# Patient Record
Sex: Female | Born: 1935 | Race: White | Hispanic: No | Marital: Married
Health system: Southern US, Community
[De-identification: ages and names within clinical notes are randomized; demographics above are authoritative.]

## PROBLEM LIST (undated history)

## (undated) DIAGNOSIS — I5032 Chronic diastolic (congestive) heart failure: Secondary | ICD-10-CM

## (undated) DIAGNOSIS — J9621 Acute and chronic respiratory failure with hypoxia: Secondary | ICD-10-CM

## (undated) DIAGNOSIS — Z8669 Personal history of other diseases of the nervous system and sense organs: Secondary | ICD-10-CM

## (undated) DIAGNOSIS — Z930 Tracheostomy status: Secondary | ICD-10-CM

---

## 2019-09-26 ENCOUNTER — Inpatient Hospital Stay
Admit: 2019-09-26 | Discharge: 2019-10-24 | Disposition: A | Discharge status: Skilled Nursing Facility | Payer: Medicare Other | Source: Other Acute Inpatient Hospital | Attending: Internal Medicine | Admitting: Internal Medicine

## 2019-09-26 ENCOUNTER — Other Ambulatory Visit (HOSPITAL_COMMUNITY): Payer: Medicare Other

## 2019-09-26 DIAGNOSIS — M7989 Other specified soft tissue disorders: Secondary | ICD-10-CM

## 2019-09-26 DIAGNOSIS — I509 Heart failure, unspecified: Secondary | ICD-10-CM

## 2019-09-26 DIAGNOSIS — J189 Pneumonia, unspecified organism: Secondary | ICD-10-CM

## 2019-09-26 DIAGNOSIS — Z4659 Encounter for fitting and adjustment of other gastrointestinal appliance and device: Secondary | ICD-10-CM

## 2019-09-26 DIAGNOSIS — Z930 Tracheostomy status: Secondary | ICD-10-CM

## 2019-09-26 DIAGNOSIS — I5032 Chronic diastolic (congestive) heart failure: Secondary | ICD-10-CM | POA: Diagnosis present

## 2019-09-26 DIAGNOSIS — J9621 Acute and chronic respiratory failure with hypoxia: Secondary | ICD-10-CM | POA: Diagnosis present

## 2019-09-26 DIAGNOSIS — Z8669 Personal history of other diseases of the nervous system and sense organs: Secondary | ICD-10-CM

## 2019-09-26 DIAGNOSIS — J9690 Respiratory failure, unspecified, unspecified whether with hypoxia or hypercapnia: Secondary | ICD-10-CM

## 2019-09-26 HISTORY — DX: Chronic diastolic (congestive) heart failure: I50.32

## 2019-09-26 HISTORY — DX: Tracheostomy status: Z93.0

## 2019-09-26 HISTORY — DX: Acute and chronic respiratory failure with hypoxia: J96.21

## 2019-09-26 HISTORY — DX: Personal history of other diseases of the nervous system and sense organs: Z86.69

## 2019-09-27 ENCOUNTER — Other Ambulatory Visit (HOSPITAL_COMMUNITY): Payer: Medicare Other

## 2019-09-27 ENCOUNTER — Encounter: Payer: Self-pay | Admitting: Internal Medicine

## 2019-09-27 DIAGNOSIS — I5032 Chronic diastolic (congestive) heart failure: Secondary | ICD-10-CM | POA: Diagnosis not present

## 2019-09-27 DIAGNOSIS — Z8669 Personal history of other diseases of the nervous system and sense organs: Secondary | ICD-10-CM | POA: Diagnosis not present

## 2019-09-27 DIAGNOSIS — Z930 Tracheostomy status: Secondary | ICD-10-CM | POA: Diagnosis not present

## 2019-09-27 DIAGNOSIS — J9621 Acute and chronic respiratory failure with hypoxia: Secondary | ICD-10-CM

## 2019-09-27 LAB — CBC WITH DIFFERENTIAL/PLATELET
Abs Immature Granulocytes: 0.54 10*3/uL — ABNORMAL HIGH (ref 0.00–0.07)
Basophils Absolute: 0.1 10*3/uL (ref 0.0–0.1)
Basophils Relative: 1 %
Eosinophils Absolute: 0.2 10*3/uL (ref 0.0–0.5)
Eosinophils Relative: 2 %
HCT: 44.3 % (ref 36.0–46.0)
Hemoglobin: 14.1 g/dL (ref 12.0–15.0)
Immature Granulocytes: 5 %
Lymphocytes Relative: 11 %
Lymphs Abs: 1.2 10*3/uL (ref 0.7–4.0)
MCH: 31.1 pg (ref 26.0–34.0)
MCHC: 31.8 g/dL (ref 30.0–36.0)
MCV: 97.8 fL (ref 80.0–100.0)
Monocytes Absolute: 1.1 10*3/uL — ABNORMAL HIGH (ref 0.1–1.0)
Monocytes Relative: 10 %
Neutro Abs: 7.8 10*3/uL — ABNORMAL HIGH (ref 1.7–7.7)
Neutrophils Relative %: 71 %
Platelets: 356 10*3/uL (ref 150–400)
RBC: 4.53 MIL/uL (ref 3.87–5.11)
RDW: 13 % (ref 11.5–15.5)
WBC: 10.9 10*3/uL — ABNORMAL HIGH (ref 4.0–10.5)
nRBC: 0 % (ref 0.0–0.2)

## 2019-09-27 LAB — PHOSPHORUS: Phosphorus: 3.5 mg/dL (ref 2.5–4.6)

## 2019-09-27 LAB — COMPREHENSIVE METABOLIC PANEL
ALT: 7 U/L (ref 0–44)
AST: 15 U/L (ref 15–41)
Albumin: 2.8 g/dL — ABNORMAL LOW (ref 3.5–5.0)
Alkaline Phosphatase: 78 U/L (ref 38–126)
Anion gap: 12 (ref 5–15)
BUN: 31 mg/dL — ABNORMAL HIGH (ref 8–23)
CO2: 38 mmol/L — ABNORMAL HIGH (ref 22–32)
Calcium: 9.5 mg/dL (ref 8.9–10.3)
Chloride: 91 mmol/L — ABNORMAL LOW (ref 98–111)
Creatinine, Ser: 0.72 mg/dL (ref 0.44–1.00)
GFR calc Af Amer: >60 mL/min (ref >60)
GFR calc non Af Amer: >60 mL/min (ref >60)
Glucose, Bld: 110 mg/dL — ABNORMAL HIGH (ref 70–99)
Potassium: 3.7 mmol/L (ref 3.5–5.1)
Sodium: 141 mmol/L (ref 135–145)
Total Bilirubin: 0.8 mg/dL (ref 0.3–1.2)
Total Protein: 6.6 g/dL (ref 6.5–8.1)

## 2019-09-27 LAB — PROTIME-INR
INR: 1.1 (ref 0.8–1.2)
Prothrombin Time: 13.8 seconds (ref 11.4–15.2)

## 2019-09-27 LAB — APTT: aPTT: 30 seconds (ref 24–36)

## 2019-09-27 LAB — MAGNESIUM: Magnesium: 2.2 mg/dL (ref 1.7–2.4)

## 2019-09-27 MED ORDER — IPRATROPIUM-ALBUTEROL 0.5-2.5 (3) MG/3ML IN SOLN
3.00 | RESPIRATORY_TRACT | Status: DC
Start: 2019-09-26 — End: 2019-09-27

## 2019-09-27 MED ORDER — CARBIDOPA-LEVODOPA 25-100 MG PO TABS
1.00 | ORAL_TABLET | ORAL | Status: DC
Start: 2019-09-26 — End: 2019-09-27

## 2019-09-27 MED ORDER — BISACODYL 5 MG PO TBEC
10.00 | DELAYED_RELEASE_TABLET | ORAL | Status: DC
Start: ? — End: 2019-09-27

## 2019-09-27 MED ORDER — RASAGILINE MESYLATE 1 MG PO TABS
1.00 | ORAL_TABLET | ORAL | Status: DC
Start: 2019-09-27 — End: 2019-09-27

## 2019-09-27 MED ORDER — PANTOPRAZOLE SODIUM 40 MG IV SOLR
40.00 | INTRAVENOUS | Status: DC
Start: 2019-09-26 — End: 2019-09-27

## 2019-09-27 MED ORDER — GABAPENTIN 300 MG PO CAPS
300.00 | ORAL_CAPSULE | ORAL | Status: DC
Start: 2019-09-26 — End: 2019-09-27

## 2019-09-27 MED ORDER — ENOXAPARIN SODIUM 40 MG/0.4ML ~~LOC~~ SOLN
40.00 | SUBCUTANEOUS | Status: DC
Start: 2019-09-27 — End: 2019-09-27

## 2019-09-27 MED ORDER — ACETAMINOPHEN 325 MG PO TABS
650.00 | ORAL_TABLET | ORAL | Status: DC
Start: ? — End: 2019-09-27

## 2019-09-27 MED ORDER — SODIUM CHLORIDE 3 % IN NEBU
4.00 | INHALATION_SOLUTION | RESPIRATORY_TRACT | Status: DC
Start: 2019-09-26 — End: 2019-09-27

## 2019-09-27 MED ORDER — GENERIC EXTERNAL MEDICATION
60.00 | Status: DC
Start: 2019-09-27 — End: 2019-09-27

## 2019-09-27 MED ORDER — POLYETHYLENE GLYCOL 3350 17 G PO PACK
17.00 | PACK | ORAL | Status: DC
Start: 2019-09-27 — End: 2019-09-27

## 2019-09-27 MED ORDER — TAMSULOSIN HCL 0.4 MG PO CAPS
0.40 | ORAL_CAPSULE | ORAL | Status: DC
Start: 2019-09-27 — End: 2019-09-27

## 2019-09-27 NOTE — Consult Note (Signed)
Pulmonary Critical Care Medicine [facility] [place]  PULMONARY SERVICE  Date of Service: 09/27/2019  PULMONARY CRITICAL CARE CONSULT   Erika Edwards  WFU:[id number]  DOB: 1933/07/22   DOA: 09/26/2019  Referring Physician: Carron Curie, MD  HPI: Erika Edwards is a 85 y.o. female seen for follow up of Acute on Chronic Respiratory Failure.  Patient has multiple medical problems including history of Parkinson's disease congestive heart failure hypertension presented to the hospital because of shortness of breath.  Patient ended up having to be intubated was extubated on 8 September and came back into the hospital because of issues with stridor.  Patient was evaluated by ENT noted to have good movement of the vocal cords and had an extensive admission work-up done including CT MRI.  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  The patient had minimal glottic airway noted at that time.  Patient was intubated for this airway findings.  Hospital course as follows.  ENT did see the patient and there was consideration made of the retinoid abduction however patient was not felt to be a candidate for the procedure.  So therefore patient underwent a tracheostomy on the 23rd.  The patient then was started on T collar trials and patient was transferred to our facility for further management.  She currently is due for a trach change tomorrow and she had a size XLT tracheostomy placed.  Review of Systems:  ROS performed and is unremarkable other than noted above.  PMH: Past Medical History:  Diagnosis Date  . Arthritis  . Chronic diastolic (congestive) heart failure (CMS/HCC) (HCC)  . Hypertension  . Hypokalemia 01/24/2018  . Kidney stone  . Lumbago  . Lumbar radiculopathy  . Osteoporosis  . Parkinson disease (CMS/HCC) (HCC)  . Peripheral neuropathy  . Sciatica  .  Vitamin D deficiency   PSH: Past Surgical History:  Procedure Laterality Date  . ANKLE SURGERY  Bi-lat ORIF  . CATARACT EXTRACTION, BILATERAL  . COLECTOMY N/A 08/06/2018  Procedure: SIGMOID COLECTOMY; Surgeon: Blanchard Mane, MD; Location: [facility] OR; Service: General  . CYSTOURETHROSCOPY Left 07/08/2016  Procedure: Left Ureteroscopy w/ Laser Litho and Stent Placement ; Surgeon: Reola Mosher, MD; Location: [facility] OR; Service:  . EXTRACORPOREAL SHOCK WAVE LITHOTRIPSY  . KNEE ARTHROSCOPY W/ DEBRIDEMENT  left knee  . PR SIGMOIDOSCOPY FLX DX W/COLLJ SPEC BR/WA IF PFRMD N/A 08/06/2018  Procedure: Arnell Sieving; Surgeon: Lavonda Jumbo, MD; Location: [facility] GI; Service: Gastroenterology  . URETEROSCOPY 07/08/2016 scheduled  Left with laser litho and stent placement  . WRIST FRACTURE SURGERY   Allergies: No Known Allergies   Social history: Currently patient does not smoke or drink  Medications: Reviewed on Rounds  Physical Exam:  Vitals: Temperature 97.0 pulse 85 respiratory rate 18 blood pressure 117/64 saturations 96%  Ventilator Settings off the ventilator right now on T collar trials  . General: Comfortable at this time . Eyes: Grossly normal lids, irises & conjunctiva . ENT: grossly tongue is normal . Neck: no obvious mass . Cardiovascular: S1-S2 normal no gallop or rub is noted . Respiratory: No rhonchi no rales are noted at this time. . Abdomen: Soft and nontender . Skin: no rash seen on limited exam . Musculoskeletal: not rigid . Psychiatric:unable to assess . Neurologic: no seizure no involuntary movements         Labs on Admission:  Basic Metabolic Panel: Recent  Labs  Lab 09/27/19 0444  NA 141  K 3.7  CL 91*  CO2 38*  GLUCOSE 110*  BUN 31*  CREATININE 0.72  CALCIUM 9.5  MG 2.2  PHOS 3.5    No results for input(s): PHART, PCO2ART, PO2ART, HCO3, O2SAT in the last 168 hours.  Liver Function Tests: Recent Labs  Lab 09/27/19 0444  AST 15  ALT 7   ALKPHOS 78  BILITOT 0.8  PROT 6.6  ALBUMIN 2.8*   No results for input(s): LIPASE, AMYLASE in the last 168 hours. No results for input(s): AMMONIA in the last 168 hours.  CBC: Recent Labs  Lab 09/27/19 0444  WBC 10.9*  NEUTROABS 7.8*  HGB 14.1  HCT 44.3  MCV 97.8  PLT 356    Cardiac Enzymes: No results for input(s): CKTOTAL, CKMB, CKMBINDEX, TROPONINI in the last 168 hours.  BNP (last 3 results) No results for input(s): BNP in the last 8760 hours.  ProBNP (last 3 results) No results for input(s): PROBNP in the last 8760 hours.   Radiological Exams on Admission: Dg Chest Port 1 View  Result Date: 09/26/2019 CLINICAL DATA:  85 year old female with respiratory failure, tracheostomy. EXAM: PORTABLE CHEST 1 VIEW COMPARISON:  None. FINDINGS: Portable AP semi upright view at 2226 hours. Moderately elevated right hemidiaphragm with subjacent gas-filled colon. A mix of gas and barium type contrast is noted in the visible small and large bowel. The bowel gas pattern is more suggestive of ileus than obstruction. Tracheostomy in place with no adverse features. Mild cardiomegaly. Calcified aortic atherosclerosis. Other mediastinal contours are within normal limits. Hypo ventilation at both lung bases most resembles atelectasis. The upper lungs are clear. No pneumothorax, pulmonary edema or definite effusion. No acute osseous abnormality identified. IMPRESSION: 1. Lung base atelectasis suspected, including due to moderately elevated diaphragm on the right. 2. Mix of gas and barium contrast throughout the visible bowel loops. Gas distended colon more resembles ileus than obstruction. 3. Mild cardiomegaly.  Aortic Atherosclerosis (ICD10-I70.0). 4. Tracheostomy with no adverse features. Electronically Signed   By: Genevie Ann M.D.   On: 09/26/2019 22:53    Assessment/Plan Active Problems:   Acute on chronic respiratory failure with hypoxia (HCC)   H/O Parkinson's disease   Chronic diastolic  heart failure ([place])   Tracheostomy status ([place])   1. Acute on chronic respiratory failure with hypoxia appears the patient primarily had vocal cord dysfunction as a factor related to her respiratory failure.  It may very well require long-term tracheostomy.  Speech therapy will be seeing the patient for evaluation.  We will also be changing the trach out possibly tomorrow. 2. Parkinson's disease perceivably could be contributing to her vocal cord dysfunction also we will continue with supportive care 3. Chronic diastolic heart failure monitor fluid status has some issues with atelectasis noted on the last chest x-ray along with mild cardiomegaly. 4. Tracheostomy status as indicated above I think she will probably need to keep tracheostomy in place  I have personally seen and evaluated the patient, evaluated laboratory and imaging results, formulated the assessment and plan and placed orders. The Patient requires high complexity decision making for assessment and support.  Case was discussed on Rounds with the Respiratory Therapy Staff Time Spent 76minutes  Allyne Gee, MD [facility] Pulmonary Critical Care Medicine Sleep Medicine

## 2019-09-28 DIAGNOSIS — J9621 Acute and chronic respiratory failure with hypoxia: Secondary | ICD-10-CM | POA: Diagnosis not present

## 2019-09-28 DIAGNOSIS — Z4659 Encounter for fitting and adjustment of other gastrointestinal appliance and device: Secondary | ICD-10-CM

## 2019-09-28 DIAGNOSIS — Z8669 Personal history of other diseases of the nervous system and sense organs: Secondary | ICD-10-CM | POA: Diagnosis not present

## 2019-09-28 DIAGNOSIS — I5032 Chronic diastolic (congestive) heart failure: Secondary | ICD-10-CM | POA: Diagnosis not present

## 2019-09-28 NOTE — Progress Notes (Signed)
Pulmonary Critical Care Medicine [facility] [place]   PULMONARY CRITICAL CARE SERVICE  PROGRESS NOTE  Date of Service: 09/28/2019  Erika Edwards  [id number]  DOB: Sep 01, 1933   DOA: 09/26/2019  Referring Physician: Carron Curie, MD  HPI: Erika Edwards is a 85 y.o. female seen for follow up of Acute on Chronic Respiratory Failure.  Patient currently is on T collar she is on 28% FiO2 good saturations remains nonverbal  Medications: Reviewed on Rounds  Physical Exam:  Vitals: Temperature 97.3 pulse 83 respiratory 18 blood pressure 110/60 saturations 96%  Ventilator Settings off the ventilator right now on T collar currently is on 28% FiO2  . General: Comfortable at this time . Eyes: Grossly normal lids, irises & conjunctiva . ENT: grossly tongue is normal . Neck: no obvious mass . Cardiovascular: S1 S2 normal no gallop . Respiratory: No rhonchi no rales are noted at this time . Abdomen: soft . Skin: no rash seen on limited exam . Musculoskeletal: not rigid . Psychiatric:unable to assess . Neurologic: no seizure no involuntary movements         Lab Data:   Basic Metabolic Panel: Recent Labs  Lab 09/27/19 0444  NA 141  K 3.7  CL 91*  CO2 38*  GLUCOSE 110*  BUN 31*  CREATININE 0.72  CALCIUM 9.5  MG 2.2  PHOS 3.5    ABG: No results for input(s): PHART, PCO2ART, PO2ART, HCO3, O2SAT in the last 168 hours.  Liver Function Tests: Recent Labs  Lab 09/27/19 0444  AST 15  ALT 7  ALKPHOS 78  BILITOT 0.8  PROT 6.6  ALBUMIN 2.8*   No results for input(s): LIPASE, AMYLASE in the last 168 hours. No results for input(s): AMMONIA in the last 168 hours.  CBC: Recent Labs  Lab 09/27/19 0444  WBC 10.9*  NEUTROABS 7.8*  HGB 14.1  HCT 44.3  MCV 97.8  PLT 356    Cardiac Enzymes: No results for input(s): CKTOTAL, CKMB, CKMBINDEX, TROPONINI in the last 168 hours.  BNP (last 3 results) No results for input(s): BNP in the last 8760  hours.  ProBNP (last 3 results) No results for input(s): PROBNP in the last 8760 hours.  Radiological Exams: Dg Abd 1 View  Result Date: 09/27/2019 CLINICAL DATA:  NG tube placement. EXAM: ABDOMEN - 1 VIEW COMPARISON:  None. FINDINGS: NG tube is noted with tip overlying proximal-mid stomach. Gas-filled colon and small bowel noted. IMPRESSION: NG tube with tip overlying the proximal-mid stomach. Electronically Signed   By: Harmon Pier M.D.   On: 09/27/2019 15:22   Dg Chest Port 1 View  Result Date: 09/26/2019 CLINICAL DATA:  85 year old female with respiratory failure, tracheostomy. EXAM: PORTABLE CHEST 1 VIEW COMPARISON:  None. FINDINGS: Portable AP semi upright view at 2226 hours. Moderately elevated right hemidiaphragm with subjacent gas-filled colon. A mix of gas and barium type contrast is noted in the visible small and large bowel. The bowel gas pattern is more suggestive of ileus than obstruction. Tracheostomy in place with no adverse features. Mild cardiomegaly. Calcified aortic atherosclerosis. Other mediastinal contours are within normal limits. Hypo ventilation at both lung bases most resembles atelectasis. The upper lungs are clear. No pneumothorax, pulmonary edema or definite effusion. No acute osseous abnormality identified. IMPRESSION: 1. Lung base atelectasis suspected, including due to moderately elevated diaphragm on the right. 2. Mix of gas and barium contrast throughout the visible bowel loops. Gas distended colon more resembles ileus than obstruction. 3. Mild cardiomegaly.  Aortic Atherosclerosis (  ICD10-I70.0). 4. Tracheostomy with no adverse features. Electronically Signed   By: Genevie Ann M.D.   On: 09/26/2019 22:53    Assessment/Plan Active Problems:   Acute on chronic respiratory failure with hypoxia (HCC)   H/O Parkinson's disease   Chronic diastolic heart failure ([place])   Tracheostomy status ([place])   1. Acute on chronic respiratory failure with hypoxia we will continue  weaning on T collar titrate oxygen continue pulmonary toilet 2. History of Parkinson's disease patient is at baseline we will continue with supportive care 3. Chronic diastolic heart failure at baseline we will continue present management 4. Tracheostomy status remains in place we will continue aggressive pulmonary toilet   I have personally seen and evaluated the patient, evaluated laboratory and imaging results, formulated the assessment and plan and placed orders. The Patient requires high complexity decision making for assessment and support.  Case was discussed on Rounds with the Respiratory Therapy Staff  Allyne Gee, MD [facility] Pulmonary Critical Care Medicine Sleep Medicine

## 2019-09-29 DIAGNOSIS — I5032 Chronic diastolic (congestive) heart failure: Secondary | ICD-10-CM | POA: Diagnosis not present

## 2019-09-29 DIAGNOSIS — Z8669 Personal history of other diseases of the nervous system and sense organs: Secondary | ICD-10-CM | POA: Diagnosis not present

## 2019-09-29 DIAGNOSIS — Z930 Tracheostomy status: Secondary | ICD-10-CM | POA: Diagnosis not present

## 2019-09-29 DIAGNOSIS — J9621 Acute and chronic respiratory failure with hypoxia: Secondary | ICD-10-CM | POA: Diagnosis not present

## 2019-09-29 NOTE — Progress Notes (Addendum)
Pulmonary Critical Care Medicine [place]   PULMONARY CRITICAL CARE SERVICE  PROGRESS NOTE  Date of Service: 09/29/2019  Erika Edwards  [id number]  DOB: 08-Feb-1933   DOA: 09/26/2019  Referring Physician: Merton Border, MD  HPI: Erika Edwards is a 85 y.o. female seen for follow up of Acute on Chronic Respiratory Failure.  Patient remains on aerosol trach collar 28% O2 satting well no fever or distress.  Medications: Reviewed on Rounds  Physical Exam:  Vitals: Pulse 70 respirations 16 BP 125/75 O2 sat 98% temp 97.6  Ventilator Settings ATC 28%  . General: Comfortable at this time . Eyes: Grossly normal lids, irises & conjunctiva . ENT: grossly tongue is normal . Neck: no obvious mass . Cardiovascular: S1 S2 normal no gallop . Respiratory: No rales or rhonchi noted . Abdomen: soft . Skin: no rash seen on limited exam . Musculoskeletal: not rigid . Psychiatric:unable to assess . Neurologic: no seizure no involuntary movements         Lab Data:   Basic Metabolic Panel: Recent Labs  Lab 09/27/19 0444  NA 141  K 3.7  CL 91*  CO2 38*  GLUCOSE 110*  BUN 31*  CREATININE 0.72  CALCIUM 9.5  MG 2.2  PHOS 3.5    ABG: No results for input(s): PHART, PCO2ART, PO2ART, HCO3, O2SAT in the last 168 hours.  Liver Function Tests: Recent Labs  Lab 09/27/19 0444  AST 15  ALT 7  ALKPHOS 78  BILITOT 0.8  PROT 6.6  ALBUMIN 2.8*   No results for input(s): LIPASE, AMYLASE in the last 168 hours. No results for input(s): AMMONIA in the last 168 hours.  CBC: Recent Labs  Lab 09/27/19 0444  WBC 10.9*  NEUTROABS 7.8*  HGB 14.1  HCT 44.3  MCV 97.8  PLT 356    Cardiac Enzymes: No results for input(s): CKTOTAL, CKMB, CKMBINDEX, TROPONINI in the last 168 hours.  BNP (last 3 results) No results for input(s): BNP in the last 8760 hours.  ProBNP (last 3 results) No results for input(s): PROBNP in the last 8760 hours.  Radiological Exams: No  results found.  Assessment/Plan Active Problems:   Acute on chronic respiratory failure with hypoxia (HCC)   H/O Parkinson's disease   Chronic diastolic heart failure ([place])   Tracheostomy status ([place])   1. Acute on chronic respiratory failure with hypoxia we will continue weaning on T collar titrate oxygen continue pulmonary toilet 2. History of Parkinson's disease patient is at baseline we will continue with supportive care 3. Chronic diastolic heart failure at baseline we will continue present management 4. Tracheostomy status remains in place we will continue aggressive pulmonary toilet   I have personally seen and evaluated the patient, evaluated laboratory and imaging results, formulated the assessment and plan and placed orders. The Patient requires high complexity decision making for assessment and support.  Case was discussed on Rounds with the Respiratory Therapy Staff  Allyne Gee, MD [facility] Pulmonary Critical Care Medicine Sleep Medicine

## 2019-09-30 DIAGNOSIS — Z930 Tracheostomy status: Secondary | ICD-10-CM | POA: Diagnosis not present

## 2019-09-30 DIAGNOSIS — Z8669 Personal history of other diseases of the nervous system and sense organs: Secondary | ICD-10-CM | POA: Diagnosis not present

## 2019-09-30 DIAGNOSIS — I5032 Chronic diastolic (congestive) heart failure: Secondary | ICD-10-CM | POA: Diagnosis not present

## 2019-09-30 DIAGNOSIS — J9621 Acute and chronic respiratory failure with hypoxia: Secondary | ICD-10-CM | POA: Diagnosis not present

## 2019-09-30 NOTE — Progress Notes (Addendum)
Pulmonary Critical Care Medicine [place]   PULMONARY CRITICAL CARE SERVICE  PROGRESS NOTE  Date of Service: 09/30/2019  Erika Edwards  [id number]  DOB: October 25, 1933   DOA: 09/26/2019  Referring Physician: Merton Border, MD  HPI: Erika Edwards is a 85 y.o. female seen for follow up of Acute on Chronic Respiratory Failure.  Patient remains on 28% trach collar with a moderate large amount of secretions this time satting well no fever or distress.  Medications: Reviewed on Rounds  Physical Exam:  Vitals: Pulse 90 respirations 22 BP 104/59 O2 sat 100% temp 96.8  Ventilator Settings 28% ATC  . General: Comfortable at this time . Eyes: Grossly normal lids, irises & conjunctiva . ENT: grossly tongue is normal . Neck: no obvious mass . Cardiovascular: S1 S2 normal no gallop . Respiratory: No rales or rhonchi noted . Abdomen: soft . Skin: no rash seen on limited exam . Musculoskeletal: not rigid . Psychiatric:unable to assess . Neurologic: no seizure no involuntary movements         Lab Data:   Basic Metabolic Panel: Recent Labs  Lab 09/27/19 0444  NA 141  K 3.7  CL 91*  CO2 38*  GLUCOSE 110*  BUN 31*  CREATININE 0.72  CALCIUM 9.5  MG 2.2  PHOS 3.5    ABG: No results for input(s): PHART, PCO2ART, PO2ART, HCO3, O2SAT in the last 168 hours.  Liver Function Tests: Recent Labs  Lab 09/27/19 0444  AST 15  ALT 7  ALKPHOS 78  BILITOT 0.8  PROT 6.6  ALBUMIN 2.8*   No results for input(s): LIPASE, AMYLASE in the last 168 hours. No results for input(s): AMMONIA in the last 168 hours.  CBC: Recent Labs  Lab 09/27/19 0444  WBC 10.9*  NEUTROABS 7.8*  HGB 14.1  HCT 44.3  MCV 97.8  PLT 356    Cardiac Enzymes: No results for input(s): CKTOTAL, CKMB, CKMBINDEX, TROPONINI in the last 168 hours.  BNP (last 3 results) No results for input(s): BNP in the last 8760 hours.  ProBNP (last 3 results) No results for input(s): PROBNP in the  last 8760 hours.  Radiological Exams: No results found.  Assessment/Plan Active Problems:   Acute on chronic respiratory failure with hypoxia (HCC)   H/O Parkinson's disease   Chronic diastolic heart failure ([place])   Tracheostomy status ([place])   1. Acute on chronic respiratory failure with hypoxia we will continue weaning on T collar titrate oxygen continue pulmonary toilet 2. History of Parkinson's disease patient is at baseline we will continue with supportive care 3. Chronic diastolic heart failure at baseline we will continue present management 4. Tracheostomy status remains in place we will continue aggressive pulmonary toilet   I have personally seen and evaluated the patient, evaluated laboratory and imaging results, formulated the assessment and plan and placed orders. The Patient requires high complexity decision making for assessment and support.  Case was discussed on Rounds with the Respiratory Therapy Staff  Allyne Gee, MD [facility] Pulmonary Critical Care Medicine Sleep Medicine

## 2019-10-01 DIAGNOSIS — I5032 Chronic diastolic (congestive) heart failure: Secondary | ICD-10-CM | POA: Diagnosis not present

## 2019-10-01 DIAGNOSIS — Z930 Tracheostomy status: Secondary | ICD-10-CM | POA: Diagnosis not present

## 2019-10-01 DIAGNOSIS — J9621 Acute and chronic respiratory failure with hypoxia: Secondary | ICD-10-CM | POA: Diagnosis not present

## 2019-10-01 DIAGNOSIS — Z8669 Personal history of other diseases of the nervous system and sense organs: Secondary | ICD-10-CM | POA: Diagnosis not present

## 2019-10-01 LAB — BASIC METABOLIC PANEL
Anion gap: 11 (ref 5–15)
BUN: 18 mg/dL (ref 8–23)
CO2: 38 mmol/L — ABNORMAL HIGH (ref 22–32)
Calcium: 9.2 mg/dL (ref 8.9–10.3)
Chloride: 88 mmol/L — ABNORMAL LOW (ref 98–111)
Creatinine, Ser: 0.59 mg/dL (ref 0.44–1.00)
GFR calc Af Amer: >60 mL/min (ref >60)
GFR calc non Af Amer: >60 mL/min (ref >60)
Glucose, Bld: 110 mg/dL — ABNORMAL HIGH (ref 70–99)
Potassium: 3.6 mmol/L (ref 3.5–5.1)
Sodium: 137 mmol/L (ref 135–145)

## 2019-10-01 LAB — CBC
HCT: 39.4 % (ref 36.0–46.0)
Hemoglobin: 12.9 g/dL (ref 12.0–15.0)
MCH: 31.6 pg (ref 26.0–34.0)
MCHC: 32.7 g/dL (ref 30.0–36.0)
MCV: 96.6 fL (ref 80.0–100.0)
Platelets: 331 K/uL (ref 150–400)
RBC: 4.08 MIL/uL (ref 3.87–5.11)
RDW: 12.7 % (ref 11.5–15.5)
WBC: 10.5 K/uL (ref 4.0–10.5)
nRBC: 0 % (ref 0.0–0.2)

## 2019-10-01 NOTE — Progress Notes (Addendum)
Pulmonary Critical Care Medicine [place]   PULMONARY CRITICAL CARE SERVICE  PROGRESS NOTE  Date of Service: 10/01/2019  Erika Edwards  [id number]  DOB: 04-15-1933   DOA: 09/26/2019  Referring Physician: Merton Border, MD  HPI: Erika Edwards is a 85 y.o. female seen for follow up of Acute on Chronic Respiratory Failure.  Patient remains on 20% aerosol trach collar satting well with no fever distress noted this time.  Medications: Reviewed on Rounds  Physical Exam:  Vitals: Pulse 68 respirations 17 BP 106/65 O2 sat 97% temp 97.8  Ventilator Settings ATC 20%  . General: Comfortable at this time . Eyes: Grossly normal lids, irises & conjunctiva . ENT: grossly tongue is normal . Neck: no obvious mass . Cardiovascular: S1 S2 normal no gallop . Respiratory: No rales or rhonchi noted . Abdomen: soft . Skin: no rash seen on limited exam . Musculoskeletal: not rigid . Psychiatric:unable to assess . Neurologic: no seizure no involuntary movements         Lab Data:   Basic Metabolic Panel: Recent Labs  Lab 09/27/19 0444 10/01/19 0603  NA 141 137  K 3.7 3.6  CL 91* 88*  CO2 38* 38*  GLUCOSE 110* 110*  BUN 31* 18  CREATININE 0.72 0.59  CALCIUM 9.5 9.2  MG 2.2  --   PHOS 3.5  --     ABG: No results for input(s): PHART, PCO2ART, PO2ART, HCO3, O2SAT in the last 168 hours.  Liver Function Tests: Recent Labs  Lab 09/27/19 0444  AST 15  ALT 7  ALKPHOS 78  BILITOT 0.8  PROT 6.6  ALBUMIN 2.8*   No results for input(s): LIPASE, AMYLASE in the last 168 hours. No results for input(s): AMMONIA in the last 168 hours.  CBC: Recent Labs  Lab 09/27/19 0444 10/01/19 0603  WBC 10.9* 10.5  NEUTROABS 7.8*  --   HGB 14.1 12.9  HCT 44.3 39.4  MCV 97.8 96.6  PLT 356 331    Cardiac Enzymes: No results for input(s): CKTOTAL, CKMB, CKMBINDEX, TROPONINI in the last 168 hours.  BNP (last 3 results) No results for input(s): BNP in the last 8760  hours.  ProBNP (last 3 results) No results for input(s): PROBNP in the last 8760 hours.  Radiological Exams: No results found.  Assessment/Plan Active Problems:   Acute on chronic respiratory failure with hypoxia (HCC)   H/O Parkinson's disease   Chronic diastolic heart failure ([place])   Tracheostomy status ([place])   1. Acute on chronic respiratory failure with hypoxia we will continue weaning on T collar titrate oxygen continue pulmonary toilet 2. History of Parkinson's disease patient is at baseline we will continue with supportive care 3. Chronic diastolic heart failure at baseline we will continue present management 4. Tracheostomy status remains in place we will continue aggressive pulmonary toilet   I have personally seen and evaluated the patient, evaluated laboratory and imaging results, formulated the assessment and plan and placed orders. The Patient requires high complexity decision making for assessment and support.  Case was discussed on Rounds with the Respiratory Therapy Staff  Allyne Gee, MD [facility] Pulmonary Critical Care Medicine Sleep Medicine

## 2019-10-02 DIAGNOSIS — J9621 Acute and chronic respiratory failure with hypoxia: Secondary | ICD-10-CM | POA: Diagnosis not present

## 2019-10-02 DIAGNOSIS — Z930 Tracheostomy status: Secondary | ICD-10-CM | POA: Diagnosis not present

## 2019-10-02 DIAGNOSIS — I5032 Chronic diastolic (congestive) heart failure: Secondary | ICD-10-CM | POA: Diagnosis not present

## 2019-10-02 DIAGNOSIS — Z8669 Personal history of other diseases of the nervous system and sense organs: Secondary | ICD-10-CM | POA: Diagnosis not present

## 2019-10-02 NOTE — Progress Notes (Addendum)
Pulmonary Critical Care Medicine [place]   PULMONARY CRITICAL CARE SERVICE  PROGRESS NOTE  Date of Service: 10/02/2019  Erika Edwards  [id number]  DOB: 10/27/1933   DOA: 09/26/2019  Referring Physician: Merton Border, MD  HPI: Erika Edwards is a 85 y.o. female seen for follow up of Acute on Chronic Respiratory Failure.  Patient continues on 20% T-bar using PMV with no difficulty satting well no fever or distress.  Medications: Reviewed on Rounds  Physical Exam:  Vitals: Pulse 84 respirations 18 BP 108/59 O2 sat 95.97.7  Ventilator Settings ATC 28%  . General: Comfortable at this time . Eyes: Grossly normal lids, irises & conjunctiva . ENT: grossly tongue is normal . Neck: no obvious mass . Cardiovascular: S1 S2 normal no gallop . Respiratory: No rales or rhonchi noted . Abdomen: soft . Skin: no rash seen on limited exam . Musculoskeletal: not rigid . Psychiatric:unable to assess . Neurologic: no seizure no involuntary movements         Lab Data:   Basic Metabolic Panel: Recent Labs  Lab 09/27/19 0444 10/01/19 0603  NA 141 137  K 3.7 3.6  CL 91* 88*  CO2 38* 38*  GLUCOSE 110* 110*  BUN 31* 18  CREATININE 0.72 0.59  CALCIUM 9.5 9.2  MG 2.2  --   PHOS 3.5  --     ABG: No results for input(s): PHART, PCO2ART, PO2ART, HCO3, O2SAT in the last 168 hours.  Liver Function Tests: Recent Labs  Lab 09/27/19 0444  AST 15  ALT 7  ALKPHOS 78  BILITOT 0.8  PROT 6.6  ALBUMIN 2.8*   No results for input(s): LIPASE, AMYLASE in the last 168 hours. No results for input(s): AMMONIA in the last 168 hours.  CBC: Recent Labs  Lab 09/27/19 0444 10/01/19 0603  WBC 10.9* 10.5  NEUTROABS 7.8*  --   HGB 14.1 12.9  HCT 44.3 39.4  MCV 97.8 96.6  PLT 356 331    Cardiac Enzymes: No results for input(s): CKTOTAL, CKMB, CKMBINDEX, TROPONINI in the last 168 hours.  BNP (last 3 results) No results for input(s): BNP in the last 8760  hours.  ProBNP (last 3 results) No results for input(s): PROBNP in the last 8760 hours.  Radiological Exams: No results found.  Assessment/Plan Active Problems:   Acute on chronic respiratory failure with hypoxia (HCC)   H/O Parkinson's disease   Chronic diastolic heart failure ([place])   Tracheostomy status ([place])   1. Acute on chronic respiratory failure with hypoxia we will continue weaning on T collar titrate oxygen continue pulmonary toilet 2. History of Parkinson's disease patient is at baseline we will continue with supportive care 3. Chronic diastolic heart failure at baseline we will continue present management 4. Tracheostomy status remains in place we will continue aggressive pulmonary toilet   I have personally seen and evaluated the patient, evaluated laboratory and imaging results, formulated the assessment and plan and placed orders. The Patient requires high complexity decision making for assessment and support.  Case was discussed on Rounds with the Respiratory Therapy Staff  Allyne Gee, MD [facility] Pulmonary Critical Care Medicine Sleep Medicine

## 2019-10-03 DIAGNOSIS — Z8669 Personal history of other diseases of the nervous system and sense organs: Secondary | ICD-10-CM | POA: Diagnosis not present

## 2019-10-03 DIAGNOSIS — J9621 Acute and chronic respiratory failure with hypoxia: Secondary | ICD-10-CM | POA: Diagnosis not present

## 2019-10-03 DIAGNOSIS — Z930 Tracheostomy status: Secondary | ICD-10-CM | POA: Diagnosis not present

## 2019-10-03 DIAGNOSIS — I5032 Chronic diastolic (congestive) heart failure: Secondary | ICD-10-CM | POA: Diagnosis not present

## 2019-10-03 NOTE — Progress Notes (Addendum)
Pulmonary [place]   PULMONARY CRITICAL CARE SERVICE  PROGRESS NOTE  Date of Service: 10/03/2019  Erika Edwards  MOQ:[id number]  DOB: 02/25/1933   DOA: 09/26/2019  Referring Physician: Merton Border, MD  HPI: Erika Edwards is a 85 y.o. female seen for follow up of Acute on Chronic Respiratory Failure.  Patient has a 28% aerosol trach, continue  Medications: Reviewed on Rounds  Physical Exam:  Vitals: Pulse 70 rations 18 BP 94/60 percent temp 98.6  Ventilator Settings 28% ATC  . General: Comfortable at this time . Eyes: Grossly normal lids, irises & conjunctiva . ENT: grossly tongue is normal . Neck: no obvious mass . Cardiovascular: S1 S2 normal no gallop . Respiratory: No rales or rhonchi noted . Abdomen: soft . Skin: no rash seen on limited exam . Musculoskeletal: not rigid . Psychiatric:unable to assess . Neurologic: no seizure no involuntary movements         Lab Data:   Basic Metabolic Panel: Recent Labs  Lab 09/27/19 0444 10/01/19 0603  NA 141 137  K 3.7 3.6  CL 91* 88*  CO2 38* 38*  GLUCOSE 110* 110*  BUN 31* 18  CREATININE 0.72 0.59  CALCIUM 9.5 9.2  MG 2.2  --   PHOS 3.5  --     ABG: No results for input(s): PHART, PCO2ART, PO2ART, HCO3, O2SAT in the last 168 hours.  Liver Function Tests: Recent Labs  Lab 09/27/19 0444  AST 15  ALT 7  ALKPHOS 78  BILITOT 0.8  PROT 6.6  ALBUMIN 2.8*   No results for input(s): LIPASE, AMYLASE in the last 168 hours. No results for input(s): AMMONIA in the last 168 hours.  CBC: Recent Labs  Lab 09/27/19 0444 10/01/19 0603  WBC 10.9* 10.5  NEUTROABS 7.8*  --   HGB 14.1 12.9  HCT 44.3 39.4  MCV 97.8 96.6  PLT 356 331    Cardiac Enzymes: No results for input(s): CKTOTAL, CKMB, CKMBINDEX, TROPONINI in the last 168 hours.  BNP (last 3 results) No results for input(s): BNP in the last 8760 hours.  ProBNP (last 3 results) No results for input(s):  PROBNP in the last 8760 hours.  Radiological Exams: No results found.  Assessment/Plan Active Problems:   Acute on chronic respiratory failure with hypoxia (HCC)   H/O Parkinson's disease   Chronic diastolic heart failure ([place])   Tracheostomy status ([place])   1. Acute on chronic respiratory failure with hypoxia continue with aerosol trach collar 2015 use PMV as tolerated.  Continue supportive measures 2. History of Parkinson's disease patient is at baseline we will continue with supportive care 3. Chronic diastolic heart failure at baseline we will continue present management 4. Tracheostomy status remains in place we will continue aggressive pulmonary toilet   I have personally seen and evaluated the patient, evaluated laboratory and imaging results, formulated the assessment and plan and placed orders. The Patient requires high complexity decision making for assessment and support.  Case was discussed on Rounds with the Respiratory Therapy Staff  Allyne Gee, MD [facility] Pulmonary Critical Care Medicine Sleep Medicine

## 2019-10-04 DIAGNOSIS — Z8669 Personal history of other diseases of the nervous system and sense organs: Secondary | ICD-10-CM | POA: Diagnosis not present

## 2019-10-04 DIAGNOSIS — I5032 Chronic diastolic (congestive) heart failure: Secondary | ICD-10-CM | POA: Diagnosis not present

## 2019-10-04 DIAGNOSIS — Z930 Tracheostomy status: Secondary | ICD-10-CM | POA: Diagnosis not present

## 2019-10-04 DIAGNOSIS — J9621 Acute and chronic respiratory failure with hypoxia: Secondary | ICD-10-CM | POA: Diagnosis not present

## 2019-10-04 NOTE — Progress Notes (Addendum)
Pulmonary Critical Care Medicine [place]   PULMONARY CRITICAL CARE SERVICE  PROGRESS NOTE  Date of Service: 10/04/2019  Erika Edwards  WPY:[id number]  DOB: Jul 07, 1933   DOA: 09/26/2019  Referring Physician: Merton Border, MD  HPI: Erika Edwards is a 85 y.o. female seen for follow up of Acute on Chronic Respiratory Failure.  Patient mains on 20% aerosol trach collar satting well with no fever or distress.  Medications: Reviewed on Rounds  Physical Exam:  Vitals: Pulse 66 respiratory 18 BP 91/66 O2 sat 97% on 6  Ventilator Settings 20% ATC  . General: Comfortable at this time . Eyes: Grossly normal lids, irises & conjunctiva . ENT: grossly tongue is normal . Neck: no obvious mass . Cardiovascular: S1 S2 normal no gallop . Respiratory: No rales or rhonchi noted . Abdomen: soft . Skin: no rash seen on limited exam . Musculoskeletal: not rigid . Psychiatric:unable to assess . Neurologic: no seizure no involuntary movements         Lab Data:   Basic Metabolic Panel: Recent Labs  Lab 10/01/19 0603  NA 137  K 3.6  CL 88*  CO2 38*  GLUCOSE 110*  BUN 18  CREATININE 0.59  CALCIUM 9.2    ABG: No results for input(s): PHART, PCO2ART, PO2ART, HCO3, O2SAT in the last 168 hours.  Liver Function Tests: No results for input(s): AST, ALT, ALKPHOS, BILITOT, PROT, ALBUMIN in the last 168 hours. No results for input(s): LIPASE, AMYLASE in the last 168 hours. No results for input(s): AMMONIA in the last 168 hours.  CBC: Recent Labs  Lab 10/01/19 0603  WBC 10.5  HGB 12.9  HCT 39.4  MCV 96.6  PLT 331    Cardiac Enzymes: No results for input(s): CKTOTAL, CKMB, CKMBINDEX, TROPONINI in the last 168 hours.  BNP (last 3 results) No results for input(s): BNP in the last 8760 hours.  ProBNP (last 3 results) No results for input(s): PROBNP in the last 8760 hours.  Radiological Exams: No results found.  Assessment/Plan Active Problems:   Acute  on chronic respiratory failure with hypoxia (HCC)   H/O Parkinson's disease   Chronic diastolic heart failure ([place])   Tracheostomy status ([place])   1. Acute on chronic respiratory failure with hypoxia continue to require 20% FiO2 continue supportive measures pulmonary toilet. 2. History of Parkinson's disease patient is at baseline we will continue with supportive care 3. Chronic diastolic heart failure at baseline we will continue present management 4. Tracheostomy status remains in place we will continue aggressive pulmonary toilet   I have personally seen and evaluated the patient, evaluated laboratory and imaging results, formulated the assessment and plan and placed orders. The Patient requires high complexity decision making for assessment and support.  Case was discussed on Rounds with the Respiratory Therapy Staff  Allyne Gee, MD [facility] Pulmonary Critical Care Medicine Sleep Medicine

## 2019-10-05 DIAGNOSIS — I5032 Chronic diastolic (congestive) heart failure: Secondary | ICD-10-CM | POA: Diagnosis not present

## 2019-10-05 DIAGNOSIS — J9621 Acute and chronic respiratory failure with hypoxia: Secondary | ICD-10-CM | POA: Diagnosis not present

## 2019-10-05 DIAGNOSIS — Z8669 Personal history of other diseases of the nervous system and sense organs: Secondary | ICD-10-CM | POA: Diagnosis not present

## 2019-10-05 DIAGNOSIS — Z930 Tracheostomy status: Secondary | ICD-10-CM | POA: Diagnosis not present

## 2019-10-05 NOTE — Progress Notes (Addendum)
Pulmonary [place]   PULMONARY CRITICAL CARE SERVICE  PROGRESS NOTE  Date of Service: 10/05/2019  Erika Edwards  [id number]  DOB: 1933-02-24   DOA: 09/26/2019  Referring Physician: Merton Border, MD  HPI: Erika Edwards is a 85 y.o. female seen for follow up of Acute on Chronic Respiratory Failure.  Patient remains 62 on 28% aerosol trach collar using PMV with no distress.  Medications: Reviewed on Rounds  Physical Exam:  Vitals: Is qu pulse 74,000 respiration 30 BP 91/54 O2 sats 80% temp 97.4  Ventilator Settings ATC 28%  . General: Comfortable at this time . Eyes: Grossly normal lids, irises & conjunctiva . ENT: grossly tongue is normal . Neck: no obvious mass . Cardiovascular: S1 S2 normal no gallop . Respiratory: No rales or rhonchi noted . Abdomen: soft . Skin: no rash seen on limited exam . Musculoskeletal: not rigid . Psychiatric:unable to assess . Neurologic: no seizure no involuntary movements         Lab Data:   Basic Metabolic Panel: Recent Labs  Lab 10/01/19 0603  NA 137  K 3.6  CL 88*  CO2 38*  GLUCOSE 110*  BUN 18  CREATININE 0.59  CALCIUM 9.2    ABG: No results for input(s): PHART, PCO2ART, PO2ART, HCO3, O2SAT in the last 168 hours.  Liver Function Tests: No results for input(s): AST, ALT, ALKPHOS, BILITOT, PROT, ALBUMIN in the last 168 hours. No results for input(s): LIPASE, AMYLASE in the last 168 hours. No results for input(s): AMMONIA in the last 168 hours.  CBC: Recent Labs  Lab 10/01/19 0603  WBC 10.5  HGB 12.9  HCT 39.4  MCV 96.6  PLT 331    Cardiac Enzymes: No results for input(s): CKTOTAL, CKMB, CKMBINDEX, TROPONINI in the last 168 hours.  BNP (last 3 results) No results for input(s): BNP in the last 8760 hours.  ProBNP (last 3 results) No results for input(s): PROBNP in the last 8760 hours.  Radiological Exams: No results found.  Assessment/Plan Active Problems:    Acute on chronic respiratory failure with hypoxia (HCC)   H/O Parkinson's disease   Chronic diastolic heart failure ([place])   Tracheostomy status ([place])   1. Acute on chronic respiratory failure with hypoxia continue to require 2*% FiO2 continue supportive measures pulmonary toilet. 2. History of Parkinson's disease patient is at baseline we will continue with supportive care 3. Chronic diastolic heart failure at baseline we will continue present management 4. Tracheostomy status remains in place we will continue aggressive pulmonary toilet   I have personally seen and evaluated the patient, evaluated laboratory and imaging results, formulated the assessment and plan and placed orders. The Patient requires high complexity decision making for assessment and support.  Case was discussed on Rounds with the Respiratory Therapy Staff  Allyne Gee, MD [facility] Pulmonary Critical Care Medicine Sleep Medicine

## 2019-10-06 DIAGNOSIS — J9621 Acute and chronic respiratory failure with hypoxia: Secondary | ICD-10-CM | POA: Diagnosis not present

## 2019-10-06 DIAGNOSIS — I5032 Chronic diastolic (congestive) heart failure: Secondary | ICD-10-CM | POA: Diagnosis not present

## 2019-10-06 DIAGNOSIS — Z930 Tracheostomy status: Secondary | ICD-10-CM | POA: Diagnosis not present

## 2019-10-06 DIAGNOSIS — Z8669 Personal history of other diseases of the nervous system and sense organs: Secondary | ICD-10-CM | POA: Diagnosis not present

## 2019-10-06 NOTE — Progress Notes (Addendum)
Pulmonary [place]   PULMONARY CRITICAL CARE SERVICE  PROGRESS NOTE  Date of Service: 10/06/2019  Erika Edwards  [id number]:[id number]  DOB: May 18, 1933   DOA: 09/26/2019  Referring Physician: Merton Border, MD  HPI: Erika Edwards is a 85 y.o. female seen for follow up of Acute on Chronic Respiratory Failure.  Patient continues on aerosol trach at 28% FiO2 using PMV with no difficulty.  Medications: Reviewed on Rounds  Physical Exam:  Vitals: Pulse 81 respiration 22 BP 103/50 O2 sat 95% temp 96.9  Ventilator Settings ATC 28%  . General: Comfortable at this time . Eyes: Grossly normal lids, irises & conjunctiva . ENT: grossly tongue is normal . Neck: no obvious mass . Cardiovascular: S1 S2 normal no gallop . Respiratory: No rales or rhonchi noted . Abdomen: soft . Skin: no rash seen on limited exam . Musculoskeletal: not rigid . Psychiatric:unable to assess . Neurologic: no seizure no involuntary movements         Lab Data:   Basic Metabolic Panel: Recent Labs  Lab 10/01/19 0603  NA 137  K 3.6  CL 88*  CO2 38*  GLUCOSE 110*  BUN 18  CREATININE 0.59  CALCIUM 9.2    ABG: No results for input(s): PHART, PCO2ART, PO2ART, HCO3, O2SAT in the last 168 hours.  Liver Function Tests: No results for input(s): AST, ALT, ALKPHOS, BILITOT, PROT, ALBUMIN in the last 168 hours. No results for input(s): LIPASE, AMYLASE in the last 168 hours. No results for input(s): AMMONIA in the last 168 hours.  CBC: Recent Labs  Lab 10/01/19 0603  WBC 10.5  HGB 12.9  HCT 39.4  MCV 96.6  PLT 331    Cardiac Enzymes: No results for input(s): CKTOTAL, CKMB, CKMBINDEX, TROPONINI in the last 168 hours.  BNP (last 3 results) No results for input(s): BNP in the last 8760 hours.  ProBNP (last 3 results) No results for input(s): PROBNP in the last 8760 hours.  Radiological Exams: No results found.  Assessment/Plan Active Problems:   Acute  on chronic respiratory failure with hypoxia (HCC)   H/O Parkinson's disease   Chronic diastolic heart failure ([place])   Tracheostomy status ([place])   1. Acute on chronic respiratory failure with hypoxia continue to require 28% FiO2 continue supportive measures pulmonary toilet. 2. History of Parkinson's disease patient is at baseline we will continue with supportive care 3. Chronic diastolic heart failure at baseline we will continue present management 4. Tracheostomy status remains in place we will continue aggressive pulmonary toilet   I have personally seen and evaluated the patient, evaluated laboratory and imaging results, formulated the assessment and plan and placed orders. The Patient requires high complexity decision making for assessment and support.  Case was discussed on Rounds with the Respiratory Therapy Staff  Allyne Gee, MD [facility] Pulmonary Critical Care Medicine Sleep Medicine

## 2019-10-07 DIAGNOSIS — Z930 Tracheostomy status: Secondary | ICD-10-CM | POA: Diagnosis not present

## 2019-10-07 DIAGNOSIS — Z8669 Personal history of other diseases of the nervous system and sense organs: Secondary | ICD-10-CM | POA: Diagnosis not present

## 2019-10-07 DIAGNOSIS — J9621 Acute and chronic respiratory failure with hypoxia: Secondary | ICD-10-CM | POA: Diagnosis not present

## 2019-10-07 DIAGNOSIS — I5032 Chronic diastolic (congestive) heart failure: Secondary | ICD-10-CM | POA: Diagnosis not present

## 2019-10-07 LAB — BASIC METABOLIC PANEL
Anion gap: 14 (ref 5–15)
BUN: 29 mg/dL — ABNORMAL HIGH (ref 8–23)
CO2: 35 mmol/L — ABNORMAL HIGH (ref 22–32)
Calcium: 9.6 mg/dL (ref 8.9–10.3)
Chloride: 90 mmol/L — ABNORMAL LOW (ref 98–111)
Creatinine, Ser: 0.8 mg/dL (ref 0.44–1.00)
GFR calc Af Amer: >60 mL/min (ref >60)
GFR calc non Af Amer: >60 mL/min (ref >60)
Glucose, Bld: 116 mg/dL — ABNORMAL HIGH (ref 70–99)
Potassium: 3.9 mmol/L (ref 3.5–5.1)
Sodium: 139 mmol/L (ref 135–145)

## 2019-10-07 LAB — CBC
HCT: 37.3 % (ref 36.0–46.0)
Hemoglobin: 12.1 g/dL (ref 12.0–15.0)
MCH: 31.7 pg (ref 26.0–34.0)
MCHC: 32.4 g/dL (ref 30.0–36.0)
MCV: 97.6 fL (ref 80.0–100.0)
Platelets: 372 10*3/uL (ref 150–400)
RBC: 3.82 MIL/uL — ABNORMAL LOW (ref 3.87–5.11)
RDW: 12.9 % (ref 11.5–15.5)
WBC: 7.8 10*3/uL (ref 4.0–10.5)
nRBC: 0 % (ref 0.0–0.2)

## 2019-10-07 NOTE — Progress Notes (Addendum)
Pulmonary Critical Care Medicine [place]   PULMONARY CRITICAL CARE SERVICE  PROGRESS NOTE  Date of Service: 10/07/2019  Erika Edwards  [id number]  DOB: May 25, 1933   DOA: 09/26/2019  Referring Physician: Merton Border, MD  HPI: Coda Filler is a 85 y.o. female seen for follow up of Acute on Chronic Respiratory Failure.  Patient mains on aerosol trach collar 28% O2 satting well with no fever or distress.  Medications: Reviewed on Rounds  Physical Exam:  Vitals: Pulse 76 respirations 18 BP 105/56 O2 sat 93%   Ventilator Settings ATC 28%  . General: Comfortable at this time . Eyes: Grossly normal lids, irises & conjunctiva . ENT: grossly tongue is normal . Neck: no obvious mass . Cardiovascular: S1 S2 normal no gallop . Respiratory: No rales or rhonchi noted . Abdomen: soft . Skin: no rash seen on limited exam . Musculoskeletal: not rigid . Psychiatric:unable to assess . Neurologic: no seizure no involuntary movements         Lab Data:   Basic Metabolic Panel: Recent Labs  Lab 10/01/19 0603 10/07/19 0536  NA 137 139  K 3.6 3.9  CL 88* 90*  CO2 38* 35*  GLUCOSE 110* 116*  BUN 18 29*  CREATININE 0.59 0.80  CALCIUM 9.2 9.6    ABG: No results for input(s): PHART, PCO2ART, PO2ART, HCO3, O2SAT in the last 168 hours.  Liver Function Tests: No results for input(s): AST, ALT, ALKPHOS, BILITOT, PROT, ALBUMIN in the last 168 hours. No results for input(s): LIPASE, AMYLASE in the last 168 hours. No results for input(s): AMMONIA in the last 168 hours.  CBC: Recent Labs  Lab 10/01/19 0603 10/07/19 0536  WBC 10.5 7.8  HGB 12.9 12.1  HCT 39.4 37.3  MCV 96.6 97.6  PLT 331 372    Cardiac Enzymes: No results for input(s): CKTOTAL, CKMB, CKMBINDEX, TROPONINI in the last 168 hours.  BNP (last 3 results) No results for input(s): BNP in the last 8760 hours.  ProBNP (last 3 results) No results for input(s): PROBNP in the last 8760  hours.  Radiological Exams: No results found.  Assessment/Plan Active Problems:   Acute on chronic respiratory failure with hypoxia (HCC)   H/O Parkinson's disease   Chronic diastolic heart failure ([place])   Tracheostomy status ([place])   1. Acute on chronic respiratory failure with hypoxiacontinue to require 28% FiO2 continue supportive measures pulmonary toilet. 2. History of Parkinson's disease patient is at baseline we will continue with supportive care 3. Chronic diastolic heart failure at baseline we will continue present management 4. Tracheostomy status remains in place we will continue aggressive pulmonary toilet   I have personally seen and evaluated the patient, evaluated laboratory and imaging results, formulated the assessment and plan and placed orders. The Patient requires high complexity decision making for assessment and support.  Case was discussed on Rounds with the Respiratory Therapy Staff  Allyne Gee, MD [facility] Pulmonary Critical Care Medicine Sleep Medicine

## 2019-10-10 DIAGNOSIS — J9621 Acute and chronic respiratory failure with hypoxia: Secondary | ICD-10-CM | POA: Diagnosis not present

## 2019-10-10 DIAGNOSIS — Z8669 Personal history of other diseases of the nervous system and sense organs: Secondary | ICD-10-CM | POA: Diagnosis not present

## 2019-10-10 DIAGNOSIS — Z930 Tracheostomy status: Secondary | ICD-10-CM | POA: Diagnosis not present

## 2019-10-10 DIAGNOSIS — I5032 Chronic diastolic (congestive) heart failure: Secondary | ICD-10-CM | POA: Diagnosis not present

## 2019-10-10 NOTE — Progress Notes (Signed)
[name]   PULMONARY CRITICAL CARE SERVICE  PROGRESS NOTE  Date of Service: 10/10/2019  Erika Edwards  [id number]:[id number]  DOB: November 04, 1933   DOA: 09/26/2019  Referring Physician: Merton Border, MD  HPI: Erika Edwards is a 85 y.o. female seen for follow up of Acute on Chronic Respiratory Failure.  Patient currently is on T collar has been on 20% FiO2 has a history of paralyzed vocal cords she is tolerating PMV however not a candidate for decannulation.  Medications: Reviewed on Rounds  Physical Exam:  Vitals: Temperature 97.6 pulse 78 respiratory rate 16 blood pressure 118/68 saturations 96%  Ventilator Settings off the ventilator currently on T collar  . General: Comfortable at this time . Eyes: Grossly normal lids, irises & conjunctiva . ENT: grossly tongue is normal . Neck: no obvious mass . Cardiovascular: S1 S2 normal no gallop . Respiratory: No rhonchi no rales are noted at this time . Abdomen: soft . Skin: no rash seen on limited exam . Musculoskeletal: not rigid . Psychiatric:unable to assess . Neurologic: no seizure no involuntary movements         Lab Data:   Basic Metabolic Panel: Recent Labs  Lab 10/07/19 0536  NA 139  K 3.9  CL 90*  CO2 35*  GLUCOSE 116*  BUN 29*  CREATININE 0.80  CALCIUM 9.6    ABG: No results for input(s): PHART, PCO2ART, PO2ART, HCO3, O2SAT in the last 168 hours.  Liver Function Tests: No results for input(s): AST, ALT, ALKPHOS, BILITOT, PROT, ALBUMIN in the last 168 hours. No results for input(s): LIPASE, AMYLASE in the last 168 hours. No results for input(s): AMMONIA in the last 168 hours.  CBC: Recent Labs  Lab 10/07/19 0536  WBC 7.8  HGB 12.1  HCT 37.3  MCV 97.6  PLT 372    Cardiac Enzymes: No results for input(s): CKTOTAL, CKMB, CKMBINDEX, TROPONINI in the last 168 hours.  BNP (last 3 results) No results for input(s): BNP in the last 8760 hours.  ProBNP  (last 3 results) No results for input(s): PROBNP in the last 8760 hours.  Radiological Exams: No results found.  Assessment/Plan Active Problems:   Acute on chronic respiratory failure with hypoxia (HCC)   H/O Parkinson's disease   Chronic diastolic heart failure ([place])   Tracheostomy status ([place])   1. Acute on chronic respiratory failure hypoxia we will continue with on full support on T collar patient's not to be decannulated secondary to vocal cord issues.  Continue with pulmonary toilet supportive care 2. History of Parkinson's disease at baseline 3. Chronic diastolic heart failure no change 4. Tracheostomy will remain in place   I have personally seen and evaluated the patient, evaluated laboratory and imaging results, formulated the assessment and plan and placed orders. The Patient requires high complexity decision making for assessment and support.  Case was discussed on Rounds with the Respiratory Therapy Staff  Allyne Gee, MD [facility] Pulmonary Critical Care Medicine Sleep Medicine

## 2019-10-11 DIAGNOSIS — Z930 Tracheostomy status: Secondary | ICD-10-CM | POA: Diagnosis not present

## 2019-10-11 DIAGNOSIS — J9621 Acute and chronic respiratory failure with hypoxia: Secondary | ICD-10-CM | POA: Diagnosis not present

## 2019-10-11 DIAGNOSIS — I5032 Chronic diastolic (congestive) heart failure: Secondary | ICD-10-CM | POA: Diagnosis not present

## 2019-10-11 DIAGNOSIS — Z8669 Personal history of other diseases of the nervous system and sense organs: Secondary | ICD-10-CM | POA: Diagnosis not present

## 2019-10-11 NOTE — Progress Notes (Signed)
Pulmonary [place]   PULMONARY CRITICAL CARE SERVICE  PROGRESS NOTE  Date of Service: 10/11/2019  Erika Edwards  SWN:[id number]  DOB: November 28, 1933   DOA: 09/26/2019  Referring Physician: Merton Border, MD  HPI: Erika Edwards is a 85 y.o. female seen for follow up of Acute on Chronic Respiratory Failure.  Patient currently is on T collar doing fairly well right now is on 28% FiO2 using PMV without any issue.  Plan is not to do any decannulation because of cadaveric position the vocal cords  Medications: Reviewed on Rounds  Physical Exam:  Vitals: Temperature 96.6 pulse 74 respiratory 18 blood pressure 100/62 saturations 96%  Ventilator Settings off the ventilator right now on T collar currently on 28% FiO2  . General: Comfortable at this time . Eyes: Grossly normal lids, irises & conjunctiva . ENT: grossly tongue is normal . Neck: no obvious mass . Cardiovascular: S1 S2 normal no gallop . Respiratory: No rhonchi no rales are noted at this time . Abdomen: soft . Skin: no rash seen on limited exam . Musculoskeletal: not rigid . Psychiatric:unable to assess . Neurologic: no seizure no involuntary movements         Lab Data:   Basic Metabolic Panel: Recent Labs  Lab 10/07/19 0536  NA 139  K 3.9  CL 90*  CO2 35*  GLUCOSE 116*  BUN 29*  CREATININE 0.80  CALCIUM 9.6    ABG: No results for input(s): PHART, PCO2ART, PO2ART, HCO3, O2SAT in the last 168 hours.  Liver Function Tests: No results for input(s): AST, ALT, ALKPHOS, BILITOT, PROT, ALBUMIN in the last 168 hours. No results for input(s): LIPASE, AMYLASE in the last 168 hours. No results for input(s): AMMONIA in the last 168 hours.  CBC: Recent Labs  Lab 10/07/19 0536  WBC 7.8  HGB 12.1  HCT 37.3  MCV 97.6  PLT 372    Cardiac Enzymes: No results for input(s): CKTOTAL, CKMB, CKMBINDEX, TROPONINI in the last 168 hours.  BNP (last 3 results) No results for  input(s): BNP in the last 8760 hours.  ProBNP (last 3 results) No results for input(s): PROBNP in the last 8760 hours.  Radiological Exams: No results found.  Assessment/Plan Active Problems:   Acute on chronic respiratory failure with hypoxia (HCC)   H/O Parkinson's disease   Chronic diastolic heart failure ([place])   Tracheostomy status ([place])   1. Acute on chronic respiratory failure with hypoxia we will continue with the T collar trials patient currently is on 28% FiO2 with the PMV patient is not to be capped or decannulated 2. History of Parkinson's at baseline we will continue with supportive care 3. Chronic diastolic heart failure at baseline 4. Tracheostomy will remain in place   I have personally seen and evaluated the patient, evaluated laboratory and imaging results, formulated the assessment and plan and placed orders. The Patient requires high complexity decision making for assessment and support.  Case was discussed on Rounds with the Respiratory Therapy Staff  Allyne Gee, MD [facility] Pulmonary Critical Care Medicine Sleep Medicine

## 2019-10-12 DIAGNOSIS — J9621 Acute and chronic respiratory failure with hypoxia: Secondary | ICD-10-CM | POA: Diagnosis not present

## 2019-10-12 DIAGNOSIS — Z930 Tracheostomy status: Secondary | ICD-10-CM | POA: Diagnosis not present

## 2019-10-12 DIAGNOSIS — Z8669 Personal history of other diseases of the nervous system and sense organs: Secondary | ICD-10-CM | POA: Diagnosis not present

## 2019-10-12 DIAGNOSIS — I5032 Chronic diastolic (congestive) heart failure: Secondary | ICD-10-CM | POA: Diagnosis not present

## 2019-10-12 NOTE — Progress Notes (Signed)
Pulmonary [place]   PULMONARY CRITICAL CARE SERVICE  PROGRESS NOTE  Date of Service: 10/12/2019  Erika Edwards  [id number]  DOB: 1933-08-06   DOA: 09/26/2019  Referring Physician: Merton Border, MD  HPI: Erika Edwards is a 85 y.o. female seen for follow up of Acute on Chronic Respiratory Failure.  Patient currently is on T collar has been on 28% FiO2 with the PMV in place  Medications: Reviewed on Rounds  Physical Exam:  Vitals: Temperature 95.9 pulse 97 respiratory 18 blood pressure 131/78 saturations 92%  Ventilator Settings off the ventilator on T collar at baseline  . General: Comfortable at this time . Eyes: Grossly normal lids, irises & conjunctiva . ENT: grossly tongue is normal . Neck: no obvious mass . Cardiovascular: S1 S2 normal no gallop . Respiratory: No rhonchi coarse breath sounds . Abdomen: soft . Skin: no rash seen on limited exam . Musculoskeletal: not rigid . Psychiatric:unable to assess . Neurologic: no seizure no involuntary movements         Lab Data:   Basic Metabolic Panel: Recent Labs  Lab 10/07/19 0536  NA 139  K 3.9  CL 90*  CO2 35*  GLUCOSE 116*  BUN 29*  CREATININE 0.80  CALCIUM 9.6    ABG: No results for input(s): PHART, PCO2ART, PO2ART, HCO3, O2SAT in the last 168 hours.  Liver Function Tests: No results for input(s): AST, ALT, ALKPHOS, BILITOT, PROT, ALBUMIN in the last 168 hours. No results for input(s): LIPASE, AMYLASE in the last 168 hours. No results for input(s): AMMONIA in the last 168 hours.  CBC: Recent Labs  Lab 10/07/19 0536  WBC 7.8  HGB 12.1  HCT 37.3  MCV 97.6  PLT 372    Cardiac Enzymes: No results for input(s): CKTOTAL, CKMB, CKMBINDEX, TROPONINI in the last 168 hours.  BNP (last 3 results) No results for input(s): BNP in the last 8760 hours.  ProBNP (last 3 results) No results for input(s): PROBNP in the last 8760 hours.  Radiological  Exams: No results found.  Assessment/Plan Active Problems:   Acute on chronic respiratory failure with hypoxia (HCC)   H/O Parkinson's disease   Chronic diastolic heart failure ([place])   Tracheostomy status ([place])   1. Acute on chronic respiratory failure with hypoxia plan is to continue with T collar trials titrate oxygen continue pulmonary toilet patient has been doing well with the PMV 2. History of Parkinson's disease advanced disease continue with supportive care 3. Chronic diastolic heart failure at baseline 4. Tracheostomy remains in place we will continue present management   I have personally seen and evaluated the patient, evaluated laboratory and imaging results, formulated the assessment and plan and placed orders. The Patient requires high complexity decision making for assessment and support.  Case was discussed on Rounds with the Respiratory Therapy Staff  Allyne Gee, MD [facility] Pulmonary Critical Care Medicine Sleep Medicine

## 2019-10-13 DIAGNOSIS — J9621 Acute and chronic respiratory failure with hypoxia: Secondary | ICD-10-CM | POA: Diagnosis not present

## 2019-10-13 DIAGNOSIS — I5032 Chronic diastolic (congestive) heart failure: Secondary | ICD-10-CM | POA: Diagnosis not present

## 2019-10-13 DIAGNOSIS — Z930 Tracheostomy status: Secondary | ICD-10-CM | POA: Diagnosis not present

## 2019-10-13 DIAGNOSIS — Z8669 Personal history of other diseases of the nervous system and sense organs: Secondary | ICD-10-CM | POA: Diagnosis not present

## 2019-10-13 LAB — CBC
HCT: 38.5 % (ref 36.0–46.0)
Hemoglobin: 12.7 g/dL (ref 12.0–15.0)
MCH: 32.2 pg (ref 26.0–34.0)
MCHC: 33 g/dL (ref 30.0–36.0)
MCV: 97.7 fL (ref 80.0–100.0)
Platelets: 421 10*3/uL — ABNORMAL HIGH (ref 150–400)
RBC: 3.94 MIL/uL (ref 3.87–5.11)
RDW: 12.5 % (ref 11.5–15.5)
WBC: 12.9 10*3/uL — ABNORMAL HIGH (ref 4.0–10.5)
nRBC: 0 % (ref 0.0–0.2)

## 2019-10-13 LAB — BASIC METABOLIC PANEL
Anion gap: 15 (ref 5–15)
BUN: 33 mg/dL — ABNORMAL HIGH (ref 8–23)
CO2: 35 mmol/L — ABNORMAL HIGH (ref 22–32)
Calcium: 9.3 mg/dL (ref 8.9–10.3)
Chloride: 91 mmol/L — ABNORMAL LOW (ref 98–111)
Creatinine, Ser: 0.72 mg/dL (ref 0.44–1.00)
GFR calc Af Amer: >60 mL/min (ref >60)
GFR calc non Af Amer: >60 mL/min (ref >60)
Glucose, Bld: 148 mg/dL — ABNORMAL HIGH (ref 70–99)
Potassium: 3.2 mmol/L — ABNORMAL LOW (ref 3.5–5.1)
Sodium: 141 mmol/L (ref 135–145)

## 2019-10-13 NOTE — Progress Notes (Signed)
Pulmonary [place]   PULMONARY CRITICAL CARE SERVICE  PROGRESS NOTE  Date of Service: 10/13/2019  Erika Edwards  [id number]:[id number]  DOB: 24-Mar-1933   DOA: 09/26/2019  Referring Physician: Merton Border, MD  HPI: Erika Edwards is a 85 y.o. female seen for follow up of Acute on Chronic Respiratory Failure.  Patient at this time is on T collar currently 28% FiO2 using PMV as prescribed  Medications: Reviewed on Rounds  Physical Exam:  Vitals: Temperature 96.5 pulse 77 respiratory 18 blood pressure 117/60 saturations 96%  Ventilator Settings off the ventilator right now on T collar  . General: Comfortable at this time . Eyes: Grossly normal lids, irises & conjunctiva . ENT: grossly tongue is normal . Neck: no obvious mass . Cardiovascular: S1 S2 normal no gallop . Respiratory: No rhonchi no rales are noted . Abdomen: soft . Skin: no rash seen on limited exam . Musculoskeletal: not rigid . Psychiatric:unable to assess . Neurologic: no seizure no involuntary movements         Lab Data:   Basic Metabolic Panel: Recent Labs  Lab 10/07/19 0536 10/13/19 1221  NA 139 141  K 3.9 3.2*  CL 90* 91*  CO2 35* 35*  GLUCOSE 116* 148*  BUN 29* 33*  CREATININE 0.80 0.72  CALCIUM 9.6 9.3    ABG: No results for input(s): PHART, PCO2ART, PO2ART, HCO3, O2SAT in the last 168 hours.  Liver Function Tests: No results for input(s): AST, ALT, ALKPHOS, BILITOT, PROT, ALBUMIN in the last 168 hours. No results for input(s): LIPASE, AMYLASE in the last 168 hours. No results for input(s): AMMONIA in the last 168 hours.  CBC: Recent Labs  Lab 10/07/19 0536 10/13/19 1221  WBC 7.8 12.9*  HGB 12.1 12.7  HCT 37.3 38.5  MCV 97.6 97.7  PLT 372 421*    Cardiac Enzymes: No results for input(s): CKTOTAL, CKMB, CKMBINDEX, TROPONINI in the last 168 hours.  BNP (last 3 results) No results for input(s): BNP in the last 8760 hours.  ProBNP (last 3  results) No results for input(s): PROBNP in the last 8760 hours.  Radiological Exams: No results found.  Assessment/Plan Active Problems:   Acute on chronic respiratory failure with hypoxia (HCC)   H/O Parkinson's disease   Chronic diastolic heart failure ([place])   Tracheostomy status ([place])   1. Acute on chronic respiratory failure hypoxia we will continue T collar trials on 28% FiO2 we will continue with the PMV as ordered 2. History of Parkinson's advanced disease 3. Chronic diastolic heart failure at baseline 4. Tracheostomy continue with supportive care   I have personally seen and evaluated the patient, evaluated laboratory and imaging results, formulated the assessment and plan and placed orders. The Patient requires high complexity decision making for assessment and support.  Case was discussed on Rounds with the Respiratory Therapy Staff  Allyne Gee, MD [facility] Pulmonary Critical Care Medicine Sleep Medicine

## 2019-10-14 DIAGNOSIS — Z930 Tracheostomy status: Secondary | ICD-10-CM | POA: Diagnosis not present

## 2019-10-14 DIAGNOSIS — I5032 Chronic diastolic (congestive) heart failure: Secondary | ICD-10-CM | POA: Diagnosis not present

## 2019-10-14 DIAGNOSIS — Z8669 Personal history of other diseases of the nervous system and sense organs: Secondary | ICD-10-CM | POA: Diagnosis not present

## 2019-10-14 DIAGNOSIS — J9621 Acute and chronic respiratory failure with hypoxia: Secondary | ICD-10-CM | POA: Diagnosis not present

## 2019-10-14 LAB — URINALYSIS, ROUTINE W REFLEX MICROSCOPIC
Bilirubin Urine: NEGATIVE
Glucose, UA: NEGATIVE mg/dL
Hgb urine dipstick: NEGATIVE
Ketones, ur: NEGATIVE mg/dL
Nitrite: NEGATIVE
Protein, ur: NEGATIVE mg/dL
Specific Gravity, Urine: 1.012 (ref 1.005–1.030)
WBC, UA: >50 WBC/hpf — ABNORMAL HIGH (ref 0–5)
pH: 6 (ref 5.0–8.0)

## 2019-10-14 NOTE — Progress Notes (Addendum)
Pulmonary [place]   PULMONARY CRITICAL CARE SERVICE  PROGRESS NOTE  Date of Service: 10/14/2019  Erika Edwards  [id number]  DOB: [birth year]   DOA: 09/26/2019  Referring Physician: Merton Border, MD  HPI: Erika Edwards is a 85 y.o. female seen for follow up of Acute on Chronic Respiratory Failure.  Patient is on 20% aerosol trach collar difficulty.  Satting well with no distress.  Medications: Reviewed on Rounds  Physical Exam:  Vitals: Pulse 83 respirations 16 BP 119/56 O2 sat 99% temp 96.8  Ventilator Settings 28% aerosol trach collar  . General: Comfortable at this time . Eyes: Grossly normal lids, irises & conjunctiva . ENT: grossly tongue is normal . Neck: no obvious mass . Cardiovascular: S1 S2 normal no gallop . Respiratory: No rales rhonchi noted . Abdomen: soft . Skin: no rash seen on limited exam . Musculoskeletal: not rigid . Psychiatric:unable to assess . Neurologic: no seizure no involuntary movements         Lab Data:   Basic Metabolic Panel: Recent Labs  Lab 10/13/19 1221  NA 141  K 3.2*  CL 91*  CO2 35*  GLUCOSE 148*  BUN 33*  CREATININE 0.72  CALCIUM 9.3    ABG: No results for input(s): PHART, PCO2ART, PO2ART, HCO3, O2SAT in the last 168 hours.  Liver Function Tests: No results for input(s): AST, ALT, ALKPHOS, BILITOT, PROT, ALBUMIN in the last 168 hours. No results for input(s): LIPASE, AMYLASE in the last 168 hours. No results for input(s): AMMONIA in the last 168 hours.  CBC: Recent Labs  Lab 10/13/19 1221  WBC 12.9*  HGB 12.7  HCT 38.5  MCV 97.7  PLT 421*    Cardiac Enzymes: No results for input(s): CKTOTAL, CKMB, CKMBINDEX, TROPONINI in the last 168 hours.  BNP (last 3 results) No results for input(s): BNP in the last 8760 hours.  ProBNP (last 3 results) No results for input(s): PROBNP in the last 8760 hours.  Radiological Exams: No results  found.  Assessment/Plan Active Problems:   Acute on chronic respiratory failure with hypoxia (HCC)   H/O Parkinson's disease   Chronic diastolic heart failure ([place])   Tracheostomy status ([place])   1. Acute on chronic respiratory failure hypoxia we will continue T collar trials on 28% FiO2 we will continue with the PMV as ordered 2. History of Parkinson's advanced disease 3. Chronic diastolic heart failure at baseline 4. Tracheostomy continue with supportive care   I have personally seen and evaluated the patient, evaluated laboratory and imaging results, formulated the assessment and plan and placed orders. The Patient requires high complexity decision making for assessment and support.  Case was discussed on Rounds with the Respiratory Therapy Staff  Allyne Gee, MD [facility] Pulmonary Critical Care Medicine Sleep Medicine

## 2019-10-15 ENCOUNTER — Other Ambulatory Visit (HOSPITAL_COMMUNITY): Payer: Medicare Other

## 2019-10-15 DIAGNOSIS — Z930 Tracheostomy status: Secondary | ICD-10-CM | POA: Diagnosis not present

## 2019-10-15 DIAGNOSIS — I5032 Chronic diastolic (congestive) heart failure: Secondary | ICD-10-CM | POA: Diagnosis not present

## 2019-10-15 DIAGNOSIS — J9621 Acute and chronic respiratory failure with hypoxia: Secondary | ICD-10-CM | POA: Diagnosis not present

## 2019-10-15 DIAGNOSIS — Z8669 Personal history of other diseases of the nervous system and sense organs: Secondary | ICD-10-CM | POA: Diagnosis not present

## 2019-10-15 LAB — POTASSIUM: Potassium: 3.2 mmol/L — ABNORMAL LOW (ref 3.5–5.1)

## 2019-10-15 LAB — URINE CULTURE: Culture: NO GROWTH

## 2019-10-15 NOTE — Progress Notes (Addendum)
Pulmonary [place]   PULMONARY CRITICAL CARE SERVICE  PROGRESS NOTE  Date of Service: 10/15/2019  Erika Edwards  [id number]  DOB: 07/01/1933   DOA: 09/26/2019  Referring Physician: Merton Border, MD  HPI: Erika Edwards is a 85 y.o. female seen for follow up of Acute on Chronic Respiratory Failure.  Patient remains on aerosol trach difficulty.  Medications: Reviewed on Rounds  Physical Exam:  Vitals: Pulse 87 respirations 13 BP 130/64 O2 sat has absent temp 100.4  Ventilator Settings ATC 20  . General: Comfortable at this time . Eyes: Grossly normal lids, irises & conjunctiva . ENT: grossly tongue is normal . Neck: no obvious mass . Cardiovascular: S1 S2 normal no gallop . Respiratory: No rales or rhonchi noted . Abdomen: soft . Skin: no rash seen on limited exam . Musculoskeletal: not rigid . Psychiatric:unable to assess . Neurologic: no seizure no involuntary movements         Lab Data:   Basic Metabolic Panel: Recent Labs  Lab 10/13/19 1221 10/15/19 0659  NA 141  --   K 3.2* 3.2*  CL 91*  --   CO2 35*  --   GLUCOSE 148*  --   BUN 33*  --   CREATININE 0.72  --   CALCIUM 9.3  --     ABG: No results for input(s): PHART, PCO2ART, PO2ART, HCO3, O2SAT in the last 168 hours.  Liver Function Tests: No results for input(s): AST, ALT, ALKPHOS, BILITOT, PROT, ALBUMIN in the last 168 hours. No results for input(s): LIPASE, AMYLASE in the last 168 hours. No results for input(s): AMMONIA in the last 168 hours.  CBC: Recent Labs  Lab 10/13/19 1221  WBC 12.9*  HGB 12.7  HCT 38.5  MCV 97.7  PLT 421*    Cardiac Enzymes: No results for input(s): CKTOTAL, CKMB, CKMBINDEX, TROPONINI in the last 168 hours.  BNP (last 3 results) No results for input(s): BNP in the last 8760 hours.  ProBNP (last 3 results) No results for input(s): PROBNP in the last 8760 hours.  Radiological Exams: Dg Chest Port 1  View  Result Date: 10/15/2019 CLINICAL DATA:  Pneumonia EXAM: PORTABLE CHEST 1 VIEW COMPARISON:  09/26/2019 FINDINGS: Grossly unchanged cardiac silhouette and mediastinal contours with atherosclerotic plaque within the thoracic aorta. Stable position of support apparatus. A vertically oriented skin fold overlies the right midclavicular line. No pneumothorax. There is persistent elevation/eventration of the right hemidiaphragm. Grossly unchanged bibasilar heterogeneous opacities. No new focal airspace opacities. No evidence of edema. No pleural effusion. No acute osseous abnormalities. IMPRESSION: 1.  Stable positioning of support apparatus.  No pneumothorax. 2. Persistent elevation/eventration of the right hemidiaphragm with associated bibasilar opacities, likely atelectasis. Electronically Signed   By: Sandi Mariscal M.D.   On: 10/15/2019 06:55    Assessment/Plan Active Problems:   Acute on chronic respiratory failure with hypoxia (HCC)   H/O Parkinson's disease   Chronic diastolic heart failure ([place])   Tracheostomy status ([place])   1. Acute on chronic respiratory failure hypoxia we will continue T collar trials on 28% FiO2 we will continue with the PMV as ordered 2. History of Parkinson's advanced disease 3. Chronic diastolic heart failure at baseline 4. Tracheostomy continue with supportive care   I have personally seen and evaluated the patient, evaluated laboratory and imaging results, formulated the assessment and plan and placed orders. The Patient requires high complexity decision making for assessment and support.  Case was discussed on Rounds with  the Respiratory Therapy Staff  Allyne Gee, MD [facility] Pulmonary Critical Care Medicine Sleep Medicine

## 2019-10-16 DIAGNOSIS — I5032 Chronic diastolic (congestive) heart failure: Secondary | ICD-10-CM | POA: Diagnosis not present

## 2019-10-16 DIAGNOSIS — J9621 Acute and chronic respiratory failure with hypoxia: Secondary | ICD-10-CM | POA: Diagnosis not present

## 2019-10-16 DIAGNOSIS — Z930 Tracheostomy status: Secondary | ICD-10-CM | POA: Diagnosis not present

## 2019-10-16 DIAGNOSIS — Z8669 Personal history of other diseases of the nervous system and sense organs: Secondary | ICD-10-CM | POA: Diagnosis not present

## 2019-10-16 LAB — NOVEL CORONAVIRUS, NAA (HOSP ORDER, SEND-OUT TO REF LAB; TAT 18-24 HRS): SARS-CoV-2, NAA: NOT DETECTED

## 2019-10-16 LAB — POTASSIUM: Potassium: 3.6 mmol/L (ref 3.5–5.1)

## 2019-10-16 NOTE — Progress Notes (Addendum)
Pulmonary [place]   PULMONARY CRITICAL CARE SERVICE  PROGRESS NOTE  Date of Service: 10/16/2019  [name]  [id number]  DOB: 1933-01-07   DOA: 09/26/2019  Referring Physician: Merton Border, MD  HPI: Erika Edwards is a 85 y.o. female seen for follow up of Acute on Chronic Respiratory Failure.  Patient continues to distress  Medications: Reviewed on Rounds  Physical Exam:  Vitals: Pulse 86 per 17 BP 125/71 O2 sat 99% temp  Ventilator Settings ATC 28%  . General: Comfortable at this time . Eyes: Grossly normal lids, irises & conjunctiva . ENT: grossly tongue is normal . Neck: no obvious mass . Cardiovascular: S1 S2 normal no gallop . Respiratory: No rales or rhonchi noted . Abdomen: soft . Skin: no rash seen on limited exam . Musculoskeletal: not rigid . Psychiatric:unable to assess . Neurologic: no seizure no involuntary movements         Lab Data:   Basic Metabolic Panel: Recent Labs  Lab 10/13/19 1221 10/15/19 0659 10/16/19 0721  NA 141  --   --   K 3.2* 3.2* 3.6  CL 91*  --   --   CO2 35*  --   --   GLUCOSE 148*  --   --   BUN 33*  --   --   CREATININE 0.72  --   --   CALCIUM 9.3  --   --     ABG: No results for input(s): PHART, PCO2ART, PO2ART, HCO3, O2SAT in the last 168 hours.  Liver Function Tests: No results for input(s): AST, ALT, ALKPHOS, BILITOT, PROT, ALBUMIN in the last 168 hours. No results for input(s): LIPASE, AMYLASE in the last 168 hours. No results for input(s): AMMONIA in the last 168 hours.  CBC: Recent Labs  Lab 10/13/19 1221  WBC 12.9*  HGB 12.7  HCT 38.5  MCV 97.7  PLT 421*    Cardiac Enzymes: No results for input(s): CKTOTAL, CKMB, CKMBINDEX, TROPONINI in the last 168 hours.  BNP (last 3 results) No results for input(s): BNP in the last 8760 hours.  ProBNP (last 3 results) No results for input(s): PROBNP in the last 8760 hours.  Radiological Exams: Dg Chest  Port 1 View  Result Date: 10/15/2019 CLINICAL DATA:  Pneumonia EXAM: PORTABLE CHEST 1 VIEW COMPARISON:  09/26/2019 FINDINGS: Grossly unchanged cardiac silhouette and mediastinal contours with atherosclerotic plaque within the thoracic aorta. Stable position of support apparatus. A vertically oriented skin fold overlies the right midclavicular line. No pneumothorax. There is persistent elevation/eventration of the right hemidiaphragm. Grossly unchanged bibasilar heterogeneous opacities. No new focal airspace opacities. No evidence of edema. No pleural effusion. No acute osseous abnormalities. IMPRESSION: 1.  Stable positioning of support apparatus.  No pneumothorax. 2. Persistent elevation/eventration of the right hemidiaphragm with associated bibasilar opacities, likely atelectasis. Electronically Signed   By: Sandi Mariscal M.D.   On: 10/15/2019 06:55    Assessment/Plan Active Problems:   Acute on chronic respiratory failure with hypoxia (HCC)   H/O Parkinson's disease   Chronic diastolic heart failure ([place])   Tracheostomy status ([place])   1. Acute on chronic respiratory failure hypoxia we will continue T collar trials on 28% FiO2 we will continue with the PMV as ordered 2. History of Parkinson's advanced disease 3. Chronic diastolic heart failure at baseline 4. Tracheostomy continue with supportive care   I have personally seen and evaluated the patient, evaluated laboratory and imaging results, formulated the assessment and plan and  placed orders. The Patient requires high complexity decision making for assessment and support.  Case was discussed on Rounds with the Respiratory Therapy Staff  Allyne Gee, MD [facility] Pulmonary Critical Care Medicine Sleep Medicine

## 2019-10-17 ENCOUNTER — Other Ambulatory Visit (HOSPITAL_COMMUNITY): Payer: Medicare Other

## 2019-10-17 DIAGNOSIS — I5032 Chronic diastolic (congestive) heart failure: Secondary | ICD-10-CM | POA: Diagnosis not present

## 2019-10-17 DIAGNOSIS — Z8669 Personal history of other diseases of the nervous system and sense organs: Secondary | ICD-10-CM | POA: Diagnosis not present

## 2019-10-17 DIAGNOSIS — J9621 Acute and chronic respiratory failure with hypoxia: Secondary | ICD-10-CM | POA: Diagnosis not present

## 2019-10-17 DIAGNOSIS — Z930 Tracheostomy status: Secondary | ICD-10-CM | POA: Diagnosis not present

## 2019-10-17 LAB — CBC
HCT: 34.8 % — ABNORMAL LOW (ref 36.0–46.0)
Hemoglobin: 10.6 g/dL — ABNORMAL LOW (ref 12.0–15.0)
MCH: 31.3 pg (ref 26.0–34.0)
MCHC: 30.5 g/dL (ref 30.0–36.0)
MCV: 102.7 fL — ABNORMAL HIGH (ref 80.0–100.0)
Platelets: 389 10*3/uL (ref 150–400)
RBC: 3.39 MIL/uL — ABNORMAL LOW (ref 3.87–5.11)
RDW: 13.2 % (ref 11.5–15.5)
WBC: 10 10*3/uL (ref 4.0–10.5)
nRBC: 0 % (ref 0.0–0.2)

## 2019-10-17 LAB — BASIC METABOLIC PANEL
Anion gap: 10 (ref 5–15)
BUN: 28 mg/dL — ABNORMAL HIGH (ref 8–23)
CO2: 31 mmol/L (ref 22–32)
Calcium: 8.9 mg/dL (ref 8.9–10.3)
Chloride: 110 mmol/L (ref 98–111)
Creatinine, Ser: 0.57 mg/dL (ref 0.44–1.00)
GFR calc Af Amer: >60 mL/min (ref >60)
GFR calc non Af Amer: >60 mL/min (ref >60)
Glucose, Bld: 133 mg/dL — ABNORMAL HIGH (ref 70–99)
Potassium: 3.1 mmol/L — ABNORMAL LOW (ref 3.5–5.1)
Sodium: 151 mmol/L — ABNORMAL HIGH (ref 135–145)

## 2019-10-17 NOTE — Progress Notes (Signed)
Pulmonary [place]   PULMONARY CRITICAL CARE SERVICE  PROGRESS NOTE  Date of Service: 10/17/2019  Erika Edwards  [id number]  DOB: 04/08/1933   DOA: 09/26/2019  Referring Physician: Merton Border, MD  HPI: Erika Edwards is a 85 y.o. female seen for follow up of Acute on Chronic Respiratory Failure.  Patient currently is on T collar has been on 28% FiO2 with good saturations  Medications: Reviewed on Rounds  Physical Exam:  Vitals: Temperature 100.9 pulse 89 respiratory rate 18 blood pressure 123/66 saturations 98%  Ventilator Settings currently on T collar with an FiO2 of 28%  . General: Comfortable at this time . Eyes: Grossly normal lids, irises & conjunctiva . ENT: grossly tongue is normal . Neck: no obvious mass . Cardiovascular: S1 S2 normal no gallop . Respiratory: No rhonchi no rales are noted at this time . Abdomen: soft . Skin: no rash seen on limited exam . Musculoskeletal: not rigid . Psychiatric:unable to assess . Neurologic: no seizure no involuntary movements         Lab Data:   Basic Metabolic Panel: Recent Labs  Lab 10/13/19 1221 10/15/19 0659 10/16/19 0721 10/17/19 0826  NA 141  --   --  151*  K 3.2* 3.2* 3.6 3.1*  CL 91*  --   --  110  CO2 35*  --   --  31  GLUCOSE 148*  --   --  133*  BUN 33*  --   --  28*  CREATININE 0.72  --   --  0.57  CALCIUM 9.3  --   --  8.9    ABG: No results for input(s): PHART, PCO2ART, PO2ART, HCO3, O2SAT in the last 168 hours.  Liver Function Tests: No results for input(s): AST, ALT, ALKPHOS, BILITOT, PROT, ALBUMIN in the last 168 hours. No results for input(s): LIPASE, AMYLASE in the last 168 hours. No results for input(s): AMMONIA in the last 168 hours.  CBC: Recent Labs  Lab 10/13/19 1221 10/17/19 0826  WBC 12.9* 10.0  HGB 12.7 10.6*  HCT 38.5 34.8*  MCV 97.7 102.7*  PLT 421* 389    Cardiac Enzymes: No results for input(s): CKTOTAL, CKMB,  CKMBINDEX, TROPONINI in the last 168 hours.  BNP (last 3 results) No results for input(s): BNP in the last 8760 hours.  ProBNP (last 3 results) No results for input(s): PROBNP in the last 8760 hours.  Radiological Exams: No results found.  Assessment/Plan Active Problems:   Acute on chronic respiratory failure with hypoxia (HCC)   H/O Parkinson's disease   Chronic diastolic heart failure ([place])   Tracheostomy status ([place])   1. Acute on chronic respiratory failure with hypoxia plan is to continue with T collar trials patient is on 28% FiO2 at this time 2. History of Parkinson's disease patient is at baseline 3. Chronic diastolic heart failure compensated 4. Tracheostomy remains in place patient to be discharged with trach   I have personally seen and evaluated the patient, evaluated laboratory and imaging results, formulated the assessment and plan and placed orders. The Patient requires high complexity decision making for assessment and support.  Case was discussed on Rounds with the Respiratory Therapy Staff  Allyne Gee, MD [facility] Pulmonary Critical Care Medicine Sleep Medicine

## 2019-10-18 DIAGNOSIS — Z8669 Personal history of other diseases of the nervous system and sense organs: Secondary | ICD-10-CM | POA: Diagnosis not present

## 2019-10-18 DIAGNOSIS — J9621 Acute and chronic respiratory failure with hypoxia: Secondary | ICD-10-CM | POA: Diagnosis not present

## 2019-10-18 DIAGNOSIS — Z930 Tracheostomy status: Secondary | ICD-10-CM | POA: Diagnosis not present

## 2019-10-18 DIAGNOSIS — I5032 Chronic diastolic (congestive) heart failure: Secondary | ICD-10-CM | POA: Diagnosis not present

## 2019-10-18 LAB — POTASSIUM: Potassium: 2.6 mmol/L — CL (ref 3.5–5.1)

## 2019-10-18 LAB — CULTURE, BLOOD (ROUTINE X 2): Special Requests: ADEQUATE

## 2019-10-18 NOTE — Progress Notes (Signed)
Pulmonary [place]   PULMONARY CRITICAL CARE SERVICE  PROGRESS NOTE  Date of Service: 10/18/2019  Erika Edwards  [id number]  DOB: August 01, 1933   DOA: 09/26/2019  Referring Physician: Merton Border, MD  HPI: Erika Edwards is a 85 y.o. female seen for follow up of Acute on Chronic Respiratory Failure.  She is at baseline awaiting discharge right now is on T collar has been on 28% FiO2 using the PMV  Medications: Reviewed on Rounds  Physical Exam:  Vitals: Temperature 97.9 pulse 70 respiratory 20 blood pressure 100/58 saturations 99%  Ventilator Settings off the ventilator right now on T collar with the PMV in place  . General: Comfortable at this time . Eyes: Grossly normal lids, irises & conjunctiva . ENT: grossly tongue is normal . Neck: no obvious mass . Cardiovascular: S1 S2 normal no gallop . Respiratory: Scattered distant rhonchi are noted . Abdomen: soft . Skin: no rash seen on limited exam . Musculoskeletal: not rigid . Psychiatric:unable to assess . Neurologic: no seizure no involuntary movements         Lab Data:   Basic Metabolic Panel: Recent Labs  Lab 10/13/19 1221 10/15/19 0659 10/16/19 0721 10/17/19 0826 10/18/19 0520  NA 141  --   --  151*  --   K 3.2* 3.2* 3.6 3.1* 2.6*  CL 91*  --   --  110  --   CO2 35*  --   --  31  --   GLUCOSE 148*  --   --  133*  --   BUN 33*  --   --  28*  --   CREATININE 0.72  --   --  0.57  --   CALCIUM 9.3  --   --  8.9  --     ABG: No results for input(s): PHART, PCO2ART, PO2ART, HCO3, O2SAT in the last 168 hours.  Liver Function Tests: No results for input(s): AST, ALT, ALKPHOS, BILITOT, PROT, ALBUMIN in the last 168 hours. No results for input(s): LIPASE, AMYLASE in the last 168 hours. No results for input(s): AMMONIA in the last 168 hours.  CBC: Recent Labs  Lab 10/13/19 1221 10/17/19 0826  WBC 12.9* 10.0  HGB 12.7 10.6*  HCT 38.5 34.8*  MCV 97.7 102.7*   PLT 421* 389    Cardiac Enzymes: No results for input(s): CKTOTAL, CKMB, CKMBINDEX, TROPONINI in the last 168 hours.  BNP (last 3 results) No results for input(s): BNP in the last 8760 hours.  ProBNP (last 3 results) No results for input(s): PROBNP in the last 8760 hours.  Radiological Exams: Dg Chest Port 1 View  Result Date: 10/17/2019 CLINICAL DATA:  Congestion. EXAM: PORTABLE CHEST 1 VIEW COMPARISON:  October 15, 2019. FINDINGS: Stable cardiomediastinal silhouette. Tracheostomy tube is in good position. Atherosclerosis of thoracic aorta is noted. Left lung is clear. Stable elevated right hemidiaphragm is noted with mild right basilar subsegmental atelectasis. Bony thorax is unremarkable. IMPRESSION: Stable tracheostomy tube. Stable elevated right hemidiaphragm is noted with mild right basilar subsegmental atelectasis. Aortic Atherosclerosis (ICD10-I70.0). Electronically Signed   By: Marijo Conception M.D.   On: 10/17/2019 18:46    Assessment/Plan Active Problems:   Acute on chronic respiratory failure with hypoxia (HCC)   H/O Parkinson's disease   Chronic diastolic heart failure ([place])   Tracheostomy status ([place])   1. Acute on chronic respiratory failure with hypoxia we will continue with T collar trials continue pulmonary toilet patient has been deemed  as baseline with the PMV 2. History of Parkinson's disease at baseline 3. Chronic diastolic heart failure at baseline compensated 4. Tracheostomy status no decannulation as planned   I have personally seen and evaluated the patient, evaluated laboratory and imaging results, formulated the assessment and plan and placed orders. The Patient requires high complexity decision making for assessment and support.  Case was discussed on Rounds with the Respiratory Therapy Staff  Yevonne Pax, MD [facility] Pulmonary Critical Care Medicine Sleep Medicine

## 2019-10-19 ENCOUNTER — Other Ambulatory Visit (HOSPITAL_COMMUNITY): Payer: Medicare Other

## 2019-10-19 DIAGNOSIS — J9621 Acute and chronic respiratory failure with hypoxia: Secondary | ICD-10-CM | POA: Diagnosis not present

## 2019-10-19 DIAGNOSIS — Z8669 Personal history of other diseases of the nervous system and sense organs: Secondary | ICD-10-CM | POA: Diagnosis not present

## 2019-10-19 DIAGNOSIS — I5032 Chronic diastolic (congestive) heart failure: Secondary | ICD-10-CM | POA: Diagnosis not present

## 2019-10-19 DIAGNOSIS — Z930 Tracheostomy status: Secondary | ICD-10-CM | POA: Diagnosis not present

## 2019-10-19 LAB — VANCOMYCIN, TROUGH: Vancomycin Tr: 10 ug/mL — ABNORMAL LOW (ref 15–20)

## 2019-10-19 LAB — POTASSIUM: Potassium: 3.7 mmol/L (ref 3.5–5.1)

## 2019-10-19 NOTE — Progress Notes (Signed)
Pulmonary [place]   PULMONARY CRITICAL CARE SERVICE  PROGRESS NOTE  Date of Service: 10/19/2019  Erika Edwards  [id number]  DOB: 11/04/1933   DOA: 09/26/2019  Referring Physician: Merton Border, MD  HPI: Erika Edwards is a 85 y.o. female seen for follow up of Acute on Chronic Respiratory Failure.  Patient currently is on T collar has been on 28% FiO2 good saturations  Medications: Reviewed on Rounds  Physical Exam:  Vitals: Temperature 97.2 pulse 78 respiratory rate 26 blood pressure 124/77 saturations 100%  Ventilator Settings off the ventilator right now on T collar  . General: Comfortable at this time . Eyes: Grossly normal lids, irises & conjunctiva . ENT: grossly tongue is normal . Neck: no obvious mass . Cardiovascular: S1 S2 normal no gallop . Respiratory: No rhonchi no rales are noted at this time . Abdomen: soft . Skin: no rash seen on limited exam . Musculoskeletal: not rigid . Psychiatric:unable to assess . Neurologic: no seizure no involuntary movements         Lab Data:   Basic Metabolic Panel: Recent Labs  Lab 10/13/19 1221 10/15/19 0659 10/16/19 0721 10/17/19 0826 10/18/19 0520 10/19/19 0520  NA 141  --   --  151*  --   --   K 3.2* 3.2* 3.6 3.1* 2.6* 3.7  CL 91*  --   --  110  --   --   CO2 35*  --   --  31  --   --   GLUCOSE 148*  --   --  133*  --   --   BUN 33*  --   --  28*  --   --   CREATININE 0.72  --   --  0.57  --   --   CALCIUM 9.3  --   --  8.9  --   --     ABG: No results for input(s): PHART, PCO2ART, PO2ART, HCO3, O2SAT in the last 168 hours.  Liver Function Tests: No results for input(s): AST, ALT, ALKPHOS, BILITOT, PROT, ALBUMIN in the last 168 hours. No results for input(s): LIPASE, AMYLASE in the last 168 hours. No results for input(s): AMMONIA in the last 168 hours.  CBC: Recent Labs  Lab 10/13/19 1221 10/17/19 0826  WBC 12.9* 10.0  HGB 12.7 10.6*  HCT 38.5 34.8*   MCV 97.7 102.7*  PLT 421* 389    Cardiac Enzymes: No results for input(s): CKTOTAL, CKMB, CKMBINDEX, TROPONINI in the last 168 hours.  BNP (last 3 results) No results for input(s): BNP in the last 8760 hours.  ProBNP (last 3 results) No results for input(s): PROBNP in the last 8760 hours.  Radiological Exams: Dg Chest Port 1 View  Result Date: 10/19/2019 CLINICAL DATA:  Acute on chronic respiratory failure. EXAM: PORTABLE CHEST 1 VIEW COMPARISON:  Single-view of the chest 10/17/2019 and 10/15/2019. FINDINGS: Tracheostomy tube remains in place. Marked elevation of the right hemidiaphragm relative to the left is unchanged. Mild atelectasis in the right lung base is again seen. The left lung is clear. Trace right pleural effusion noted. No pneumothorax. Atherosclerosis. No acute or focal bony abnormality. IMPRESSION: No acute disease. Trace right pleural effusion and mild subsegmental atelectasis right lung base. Atherosclerosis. Electronically Signed   By: Inge Rise M.D.   On: 10/19/2019 07:19   Dg Chest Port 1 View  Result Date: 10/17/2019 CLINICAL DATA:  Congestion. EXAM: PORTABLE CHEST 1 VIEW COMPARISON:  October 15, 2019.  FINDINGS: Stable cardiomediastinal silhouette. Tracheostomy tube is in good position. Atherosclerosis of thoracic aorta is noted. Left lung is clear. Stable elevated right hemidiaphragm is noted with mild right basilar subsegmental atelectasis. Bony thorax is unremarkable. IMPRESSION: Stable tracheostomy tube. Stable elevated right hemidiaphragm is noted with mild right basilar subsegmental atelectasis. Aortic Atherosclerosis (ICD10-I70.0). Electronically Signed   By: Lupita Raider M.D.   On: 10/17/2019 18:46    Assessment/Plan Active Problems:   Acute on chronic respiratory failure with hypoxia (HCC)   H/O Parkinson's disease   Chronic diastolic heart failure (HCC)   Tracheostomy status (HCC)   1. Acute on chronic respiratory failure hypoxia plan is to  continue with T collar trials titrate oxygen as tolerated 2. History of Parkinson's disease she is at baseline 3. Chronic diastolic heart failure compensated 4. Tracheostomy remains in place   I have personally seen and evaluated the patient, evaluated laboratory and imaging results, formulated the assessment and plan and placed orders. The Patient requires high complexity decision making for assessment and support.  Case was discussed on Rounds with the Respiratory Therapy Staff  Yevonne Pax, MD [facility] Pulmonary Critical Care Medicine Sleep Medicine

## 2019-10-20 DIAGNOSIS — J9621 Acute and chronic respiratory failure with hypoxia: Secondary | ICD-10-CM | POA: Diagnosis not present

## 2019-10-20 DIAGNOSIS — Z8669 Personal history of other diseases of the nervous system and sense organs: Secondary | ICD-10-CM | POA: Diagnosis not present

## 2019-10-20 DIAGNOSIS — Z930 Tracheostomy status: Secondary | ICD-10-CM | POA: Diagnosis not present

## 2019-10-20 DIAGNOSIS — I5032 Chronic diastolic (congestive) heart failure: Secondary | ICD-10-CM | POA: Diagnosis not present

## 2019-10-20 LAB — CULTURE, BLOOD (ROUTINE X 2): Culture: NO GROWTH

## 2019-10-20 LAB — SARS CORONAVIRUS 2 (TAT 6-24 HRS): SARS Coronavirus 2: NEGATIVE

## 2019-10-20 NOTE — Progress Notes (Addendum)
Pulmonary Critical Care Medicine [facility] [place]   PULMONARY CRITICAL CARE SERVICE  PROGRESS NOTE  Date of Service: 10/20/2019  Erika Edwards  GOT:[id number]  DOB: 12/08/1933   DOA: 09/26/2019  Referring Physician: Carron Curie, MD  HPI: Erika Edwards is a 85 y.o. female seen for follow up of Acute on Chronic Respiratory Failure.  Patient is on T collar with the PMV in place possibility of capping was discussed on rounds  Medications: Reviewed on Rounds  Physical Exam:  Vitals: Temperature 97.4 pulse 76 respiratory rate 19 blood pressure 106/58 saturations 97%  Ventilator Settings off the ventilator on PMV  . General: Comfortable at this time . Eyes: Grossly normal lids, irises & conjunctiva . ENT: grossly tongue is normal . Neck: no obvious mass . Cardiovascular: S1 S2 normal no gallop . Respiratory: No rhonchi no rales are noted at this time . Abdomen: soft . Skin: no rash seen on limited exam . Musculoskeletal: not rigid . Psychiatric:unable to assess . Neurologic: no seizure no involuntary movements         Lab Data:   Basic Metabolic Panel: Recent Labs  Lab 10/13/19 1221 10/15/19 0659 10/16/19 0721 10/17/19 0826 10/18/19 0520 10/19/19 0520  NA 141  --   --  151*  --   --   K 3.2* 3.2* 3.6 3.1* 2.6* 3.7  CL 91*  --   --  110  --   --   CO2 35*  --   --  31  --   --   GLUCOSE 148*  --   --  133*  --   --   BUN 33*  --   --  28*  --   --   CREATININE 0.72  --   --  0.57  --   --   CALCIUM 9.3  --   --  8.9  --   --     ABG: No results for input(s): PHART, PCO2ART, PO2ART, HCO3, O2SAT in the last 168 hours.  Liver Function Tests: No results for input(s): AST, ALT, ALKPHOS, BILITOT, PROT, ALBUMIN in the last 168 hours. No results for input(s): LIPASE, AMYLASE in the last 168 hours. No results for input(s): AMMONIA in the last 168 hours.  CBC: Recent Labs  Lab 10/13/19 1221 10/17/19 0826  WBC 12.9* 10.0  HGB 12.7 10.6*  HCT 38.5  34.8*  MCV 97.7 102.7*  PLT 421* 389    Cardiac Enzymes: No results for input(s): CKTOTAL, CKMB, CKMBINDEX, TROPONINI in the last 168 hours.  BNP (last 3 results) No results for input(s): BNP in the last 8760 hours.  ProBNP (last 3 results) No results for input(s): PROBNP in the last 8760 hours.  Radiological Exams: Dg Chest Port 1 View  Result Date: 10/19/2019 CLINICAL DATA:  Acute on chronic respiratory failure. EXAM: PORTABLE CHEST 1 VIEW COMPARISON:  Single-view of the chest 10/17/2019 and 10/15/2019. FINDINGS: Tracheostomy tube remains in place. Marked elevation of the right hemidiaphragm relative to the left is unchanged. Mild atelectasis in the right lung base is again seen. The left lung is clear. Trace right pleural effusion noted. No pneumothorax. Atherosclerosis. No acute or focal bony abnormality. IMPRESSION: No acute disease. Trace right pleural effusion and mild subsegmental atelectasis right lung base. Atherosclerosis. Electronically Signed   By: Drusilla Kanner M.D.   On: 10/19/2019 07:19    Assessment/Plan Active Problems:   Acute on chronic respiratory failure with hypoxia (HCC)   H/O Parkinson's disease   Chronic  diastolic heart failure ([place])   Tracheostomy status ([place])   1. Acute on chronic respiratory failure with hypoxia continue with the T collar and PMV we also did discuss the possibility of capping trials during rounds will continue to monitor respiratory therapy will assess 2. History of Parkinson's disease at baseline 3. Chronic diastolic heart failure unchanged we will continue with supportive care 4. Tracheostomy remains in place and is doing well with PMV   I have personally seen and evaluated the patient, evaluated laboratory and imaging results, formulated the assessment and plan and placed orders. The Patient requires high complexity decision making for assessment and support.  Case was discussed on Rounds with the Respiratory Therapy  Staff  Allyne Gee, MD [facility] Pulmonary Critical Care Medicine Sleep Medicine

## 2019-10-21 ENCOUNTER — Other Ambulatory Visit (HOSPITAL_COMMUNITY): Payer: Medicare Other

## 2019-10-21 DIAGNOSIS — Z930 Tracheostomy status: Secondary | ICD-10-CM | POA: Diagnosis not present

## 2019-10-21 DIAGNOSIS — I5032 Chronic diastolic (congestive) heart failure: Secondary | ICD-10-CM | POA: Diagnosis not present

## 2019-10-21 DIAGNOSIS — Z8669 Personal history of other diseases of the nervous system and sense organs: Secondary | ICD-10-CM | POA: Diagnosis not present

## 2019-10-21 DIAGNOSIS — J9621 Acute and chronic respiratory failure with hypoxia: Secondary | ICD-10-CM | POA: Diagnosis not present

## 2019-10-21 LAB — BASIC METABOLIC PANEL
Anion gap: 11 (ref 5–15)
BUN: 19 mg/dL (ref 8–23)
CO2: 30 mmol/L (ref 22–32)
Calcium: 8.8 mg/dL — ABNORMAL LOW (ref 8.9–10.3)
Chloride: 100 mmol/L (ref 98–111)
Creatinine, Ser: 0.45 mg/dL (ref 0.44–1.00)
GFR calc Af Amer: >60 mL/min (ref >60)
GFR calc non Af Amer: >60 mL/min (ref >60)
Glucose, Bld: 136 mg/dL — ABNORMAL HIGH (ref 70–99)
Potassium: 3.7 mmol/L (ref 3.5–5.1)
Sodium: 141 mmol/L (ref 135–145)

## 2019-10-21 LAB — CBC
HCT: 34.5 % — ABNORMAL LOW (ref 36.0–46.0)
Hemoglobin: 11.1 g/dL — ABNORMAL LOW (ref 12.0–15.0)
MCH: 31.3 pg (ref 26.0–34.0)
MCHC: 32.2 g/dL (ref 30.0–36.0)
MCV: 97.2 fL (ref 80.0–100.0)
Platelets: 356 10*3/uL (ref 150–400)
RBC: 3.55 MIL/uL — ABNORMAL LOW (ref 3.87–5.11)
RDW: 12.2 % (ref 11.5–15.5)
WBC: 9.6 10*3/uL (ref 4.0–10.5)
nRBC: 0 % (ref 0.0–0.2)

## 2019-10-21 LAB — VANCOMYCIN, TROUGH: Vancomycin Tr: 15 ug/mL (ref 15–20)

## 2019-10-21 LAB — MAGNESIUM: Magnesium: 2.1 mg/dL (ref 1.7–2.4)

## 2019-10-21 NOTE — Progress Notes (Signed)
Pulmonary Critical Care Medicine [facility] [place]   PULMONARY CRITICAL CARE SERVICE  PROGRESS NOTE  Date of Service: 10/21/2019  Erika Edwards  YTK:[id number]  DOB: 07-Aug-1933   DOA: 09/26/2019  Referring Physician: Carron Curie, MD  HPI: Erika Edwards is a 85 y.o. female seen for follow up of Acute on Chronic Respiratory Failure.  Patient right now is on T collar has been on 28% FiO2 with good saturations noted  Medications: Reviewed on Rounds  Physical Exam:  Vitals: Temperature 97.4 pulse 68 respiratory rate 20 blood pressure 111/67 saturations 97%  Ventilator Settings currently is off the ventilator on T collar with an FiO2 of 28%  . General: Comfortable at this time . Eyes: Grossly normal lids, irises & conjunctiva . ENT: grossly tongue is normal . Neck: no obvious mass . Cardiovascular: S1 S2 normal no gallop . Respiratory: No rhonchi no rales . Abdomen: soft . Skin: no rash seen on limited exam . Musculoskeletal: not rigid . Psychiatric:unable to assess . Neurologic: no seizure no involuntary movements         Lab Data:   Basic Metabolic Panel: Recent Labs  Lab 10/16/19 0721 10/17/19 0826 10/18/19 0520 10/19/19 0520 10/21/19 0916  NA  --  151*  --   --  141  K 3.6 3.1* 2.6* 3.7 3.7  CL  --  110  --   --  100  CO2  --  31  --   --  30  GLUCOSE  --  133*  --   --  136*  BUN  --  28*  --   --  19  CREATININE  --  0.57  --   --  0.45  CALCIUM  --  8.9  --   --  8.8*  MG  --   --   --   --  2.1    ABG: No results for input(s): PHART, PCO2ART, PO2ART, HCO3, O2SAT in the last 168 hours.  Liver Function Tests: No results for input(s): AST, ALT, ALKPHOS, BILITOT, PROT, ALBUMIN in the last 168 hours. No results for input(s): LIPASE, AMYLASE in the last 168 hours. No results for input(s): AMMONIA in the last 168 hours.  CBC: Recent Labs  Lab 10/17/19 0826 10/21/19 0916  WBC 10.0 9.6  HGB 10.6* 11.1*  HCT 34.8* 34.5*  MCV 102.7* 97.2   PLT 389 356    Cardiac Enzymes: No results for input(s): CKTOTAL, CKMB, CKMBINDEX, TROPONINI in the last 168 hours.  BNP (last 3 results) No results for input(s): BNP in the last 8760 hours.  ProBNP (last 3 results) No results for input(s): PROBNP in the last 8760 hours.  Radiological Exams: Ct Head Wo Contrast  Result Date: 10/21/2019 CLINICAL DATA:  Patient admitted to the hospital 09/26/2019. Altered mental status today. EXAM: CT HEAD WITHOUT CONTRAST CT CERVICAL SPINE WITHOUT CONTRAST TECHNIQUE: Multidetector CT imaging of the head and cervical spine was performed following the standard protocol without intravenous contrast. Multiplanar CT image reconstructions of the cervical spine were also generated. COMPARISON:  None. FINDINGS: CT HEAD FINDINGS Brain: No evidence of acute infarction, hemorrhage, hydrocephalus, extra-axial collection or mass lesion/mass effect. Vascular: No hyperdense vessel or unexpected calcification. Skull: Intact.  No focal lesion. Sinuses/Orbits: Status post cataract surgery on the left. Otherwise negative Other: None. CT CERVICAL SPINE FINDINGS Alignment: Facet degenerative disease results in 0.3 cm anterolisthesis C4 on C5 and trace anterolisthesis C5 on C6 and C6 on C7. There is reversal of the normal  cervical kyphosis from C5-T2. Skull base and vertebrae: No acute fracture. No primary bone lesion or focal pathologic process. Soft tissues and spinal canal: No prevertebral fluid or swelling. No visible canal hematoma. Disc levels: Multilevel degenerative disc disease and facet arthropathy. Upper chest: Lung apices clear. Other: None. IMPRESSION: No acute abnormality head or cervical spine. Multilevel cervical spondylosis. Electronically Signed   By: Inge Rise M.D.   On: 10/21/2019 11:52   Ct Cervical Spine Wo Contrast  Result Date: 10/21/2019 CLINICAL DATA:  Patient admitted to the hospital 09/26/2019. Altered mental status today. EXAM: CT HEAD WITHOUT  CONTRAST CT CERVICAL SPINE WITHOUT CONTRAST TECHNIQUE: Multidetector CT imaging of the head and cervical spine was performed following the standard protocol without intravenous contrast. Multiplanar CT image reconstructions of the cervical spine were also generated. COMPARISON:  None. FINDINGS: CT HEAD FINDINGS Brain: No evidence of acute infarction, hemorrhage, hydrocephalus, extra-axial collection or mass lesion/mass effect. Vascular: No hyperdense vessel or unexpected calcification. Skull: Intact.  No focal lesion. Sinuses/Orbits: Status post cataract surgery on the left. Otherwise negative Other: None. CT CERVICAL SPINE FINDINGS Alignment: Facet degenerative disease results in 0.3 cm anterolisthesis C4 on C5 and trace anterolisthesis C5 on C6 and C6 on C7. There is reversal of the normal cervical kyphosis from C5-T2. Skull base and vertebrae: No acute fracture. No primary bone lesion or focal pathologic process. Soft tissues and spinal canal: No prevertebral fluid or swelling. No visible canal hematoma. Disc levels: Multilevel degenerative disc disease and facet arthropathy. Upper chest: Lung apices clear. Other: None. IMPRESSION: No acute abnormality head or cervical spine. Multilevel cervical spondylosis. Electronically Signed   By: Inge Rise M.D.   On: 10/21/2019 11:52    Assessment/Plan Active Problems:   Acute on chronic respiratory failure with hypoxia (HCC)   H/O Parkinson's disease   Chronic diastolic heart failure ([place])   Tracheostomy status ([place])   1. Acute on chronic respiratory failure with hypoxia continue with T collar trials titrate oxygen as tolerated she is at baseline secretions are still quite copious 2. Parkinson's disease at baseline continue with supportive care 3. Chronic diastolic heart failure compensated 4. Tracheostomy remains in place secondary to copious secretions   I have personally seen and evaluated the patient, evaluated laboratory and imaging results,  formulated the assessment and plan and placed orders. The Patient requires high complexity decision making for assessment and support.  Case was discussed on Rounds with the Respiratory Therapy Staff  Allyne Gee, MD [facility] Pulmonary Critical Care Medicine Sleep Medicine

## 2019-10-22 ENCOUNTER — Other Ambulatory Visit (HOSPITAL_COMMUNITY): Payer: Medicare Other

## 2019-10-22 DIAGNOSIS — Z930 Tracheostomy status: Secondary | ICD-10-CM | POA: Diagnosis not present

## 2019-10-22 DIAGNOSIS — I5032 Chronic diastolic (congestive) heart failure: Secondary | ICD-10-CM | POA: Diagnosis not present

## 2019-10-22 DIAGNOSIS — J9621 Acute and chronic respiratory failure with hypoxia: Secondary | ICD-10-CM | POA: Diagnosis not present

## 2019-10-22 DIAGNOSIS — Z8669 Personal history of other diseases of the nervous system and sense organs: Secondary | ICD-10-CM | POA: Diagnosis not present

## 2019-10-22 NOTE — Progress Notes (Signed)
Pulmonary Critical Care Medicine [facility] [place]   PULMONARY CRITICAL CARE SERVICE  PROGRESS NOTE  Date of Service: 10/22/2019  Erika Edwards  HYI:[id number]  DOB: Sep 12, 1933   DOA: 09/26/2019  Referring Physician: Carron Curie, MD  HPI: Erika Edwards is a 85 y.o. female seen for follow up of Acute on Chronic Respiratory Failure.  Patient currently is on T collar has been on 20% FiO2 and is using the PMV  Medications: Reviewed on Rounds  Physical Exam:  Vitals: Temperature 98.4 temperature 68 respiratory rate 28 blood pressure 116/84 saturations 97%  Ventilator Settings off the ventilator right now on T collar with FiO2 28% using PMV  . General: Comfortable at this time . Eyes: Grossly normal lids, irises & conjunctiva . ENT: grossly tongue is normal . Neck: no obvious mass . Cardiovascular: S1 S2 normal no gallop . Respiratory: No rhonchi no rales are noted . Abdomen: soft . Skin: no rash seen on limited exam . Musculoskeletal: not rigid . Psychiatric:unable to assess . Neurologic: no seizure no involuntary movements         Lab Data:   Basic Metabolic Panel: Recent Labs  Lab 10/16/19 0721 10/17/19 0826 10/18/19 0520 10/19/19 0520 10/21/19 0916  NA  --  151*  --   --  141  K 3.6 3.1* 2.6* 3.7 3.7  CL  --  110  --   --  100  CO2  --  31  --   --  30  GLUCOSE  --  133*  --   --  136*  BUN  --  28*  --   --  19  CREATININE  --  0.57  --   --  0.45  CALCIUM  --  8.9  --   --  8.8*  MG  --   --   --   --  2.1    ABG: No results for input(s): PHART, PCO2ART, PO2ART, HCO3, O2SAT in the last 168 hours.  Liver Function Tests: No results for input(s): AST, ALT, ALKPHOS, BILITOT, PROT, ALBUMIN in the last 168 hours. No results for input(s): LIPASE, AMYLASE in the last 168 hours. No results for input(s): AMMONIA in the last 168 hours.  CBC: Recent Labs  Lab 10/17/19 0826 10/21/19 0916  WBC 10.0 9.6  HGB 10.6* 11.1*  HCT 34.8* 34.5*  MCV  102.7* 97.2  PLT 389 356    Cardiac Enzymes: No results for input(s): CKTOTAL, CKMB, CKMBINDEX, TROPONINI in the last 168 hours.  BNP (last 3 results) No results for input(s): BNP in the last 8760 hours.  ProBNP (last 3 results) No results for input(s): PROBNP in the last 8760 hours.  Radiological Exams: Ct Head Wo Contrast  Result Date: 10/21/2019 CLINICAL DATA:  Patient admitted to the hospital 09/26/2019. Altered mental status today. EXAM: CT HEAD WITHOUT CONTRAST CT CERVICAL SPINE WITHOUT CONTRAST TECHNIQUE: Multidetector CT imaging of the head and cervical spine was performed following the standard protocol without intravenous contrast. Multiplanar CT image reconstructions of the cervical spine were also generated. COMPARISON:  None. FINDINGS: CT HEAD FINDINGS Brain: No evidence of acute infarction, hemorrhage, hydrocephalus, extra-axial collection or mass lesion/mass effect. Vascular: No hyperdense vessel or unexpected calcification. Skull: Intact.  No focal lesion. Sinuses/Orbits: Status post cataract surgery on the left. Otherwise negative Other: None. CT CERVICAL SPINE FINDINGS Alignment: Facet degenerative disease results in 0.3 cm anterolisthesis C4 on C5 and trace anterolisthesis C5 on C6 and C6 on C7. There is reversal of  the normal cervical kyphosis from C5-T2. Skull base and vertebrae: No acute fracture. No primary bone lesion or focal pathologic process. Soft tissues and spinal canal: No prevertebral fluid or swelling. No visible canal hematoma. Disc levels: Multilevel degenerative disc disease and facet arthropathy. Upper chest: Lung apices clear. Other: None. IMPRESSION: No acute abnormality head or cervical spine. Multilevel cervical spondylosis. Electronically Signed   By: Inge Rise M.D.   On: 10/21/2019 11:52   Ct Cervical Spine Wo Contrast  Result Date: 10/21/2019 CLINICAL DATA:  Patient admitted to the hospital 09/26/2019. Altered mental status today. EXAM: CT HEAD  WITHOUT CONTRAST CT CERVICAL SPINE WITHOUT CONTRAST TECHNIQUE: Multidetector CT imaging of the head and cervical spine was performed following the standard protocol without intravenous contrast. Multiplanar CT image reconstructions of the cervical spine were also generated. COMPARISON:  None. FINDINGS: CT HEAD FINDINGS Brain: No evidence of acute infarction, hemorrhage, hydrocephalus, extra-axial collection or mass lesion/mass effect. Vascular: No hyperdense vessel or unexpected calcification. Skull: Intact.  No focal lesion. Sinuses/Orbits: Status post cataract surgery on the left. Otherwise negative Other: None. CT CERVICAL SPINE FINDINGS Alignment: Facet degenerative disease results in 0.3 cm anterolisthesis C4 on C5 and trace anterolisthesis C5 on C6 and C6 on C7. There is reversal of the normal cervical kyphosis from C5-T2. Skull base and vertebrae: No acute fracture. No primary bone lesion or focal pathologic process. Soft tissues and spinal canal: No prevertebral fluid or swelling. No visible canal hematoma. Disc levels: Multilevel degenerative disc disease and facet arthropathy. Upper chest: Lung apices clear. Other: None. IMPRESSION: No acute abnormality head or cervical spine. Multilevel cervical spondylosis. Electronically Signed   By: Inge Rise M.D.   On: 10/21/2019 11:52   Dg Hand 2 View Right  Result Date: 10/22/2019 CLINICAL DATA:  Right hand swelling EXAM: RIGHT HAND - 2 VIEW COMPARISON:  None. FINDINGS: No definite fracture or dislocation of the right hand. There is ulnar deviation of the digits, with some degree of subluxation of the first and second metacarpophalangeal joints, likely chronic and degenerative. Generally mild osteoarthritic pattern degenerative change about the hand and wrist, moderate in the thumb basal joints. Vascular calcinosis about the wrist. IMPRESSION: 1. No definite fracture or dislocation of the right hand. There is ulnar deviation of the digits, with some  degree of subluxation of the first and second metacarpophalangeal joints, likely chronic and degenerative. 2. Generally mild osteoarthritic pattern degenerative change about the hand and wrist, moderate in the thumb basal joints. Electronically Signed   By: Eddie Candle M.D.   On: 10/22/2019 14:40    Assessment/Plan Active Problems:   Acute on chronic respiratory failure with hypoxia (HCC)   H/O Parkinson's disease   Chronic diastolic heart failure ([place])   Tracheostomy status ([place])   1. Acute on chronic respiratory failure with hypoxia plan is to continue with T collar trial secretions are still copious limiting [place] from advancing the wean 2. History of Parkinson's disease at baseline 3. Chronic diastolic heart failure at baseline 4. Tracheostomy remains in place   I have personally seen and evaluated the patient, evaluated laboratory and imaging results, formulated the assessment and plan and placed orders. The Patient requires high complexity decision making for assessment and support.  Case was discussed on Rounds with the Respiratory Therapy Staff  Allyne Gee, MD [facility] Pulmonary Critical Care Medicine Sleep Medicine

## 2019-10-23 DIAGNOSIS — I5032 Chronic diastolic (congestive) heart failure: Secondary | ICD-10-CM | POA: Diagnosis not present

## 2019-10-23 DIAGNOSIS — Z8669 Personal history of other diseases of the nervous system and sense organs: Secondary | ICD-10-CM | POA: Diagnosis not present

## 2019-10-23 DIAGNOSIS — Z930 Tracheostomy status: Secondary | ICD-10-CM | POA: Diagnosis not present

## 2019-10-23 DIAGNOSIS — J9621 Acute and chronic respiratory failure with hypoxia: Secondary | ICD-10-CM | POA: Diagnosis not present

## 2019-10-23 LAB — SEDIMENTATION RATE: Sed Rate: 95 mm/hr — ABNORMAL HIGH (ref 0–22)

## 2019-10-23 LAB — CULTURE, BLOOD (ROUTINE X 2)
Culture: NO GROWTH
Culture: NO GROWTH

## 2019-10-23 NOTE — Progress Notes (Signed)
Pulmonary Critical Care Medicine [facility] [place]   PULMONARY CRITICAL CARE SERVICE  PROGRESS NOTE  Date of Service: 10/23/2019  Eisha Chatterjee  EQA:[id number]  DOB: 1933-08-15   DOA: 09/26/2019  Referring Physician: Carron Curie, MD  HPI: Ronin Crager is a 85 y.o. female seen for follow up of Acute on Chronic Respiratory Failure.  Patient currently is on T collar comfortable without distress at this time.  Medications: Reviewed on Rounds  Physical Exam:  Vitals: Temperature 98.5 pulse 53 respiratory 18 blood pressure 94/53 saturations 99%  Ventilator Settings off the ventilator on T collar  . General: Comfortable at this time . Eyes: Grossly normal lids, irises & conjunctiva . ENT: grossly tongue is normal . Neck: no obvious mass . Cardiovascular: S1 S2 normal no gallop . Respiratory: No rhonchi noted . Abdomen: soft . Skin: no rash seen on limited exam . Musculoskeletal: not rigid . Psychiatric:unable to assess . Neurologic: no seizure no involuntary movements         Lab Data:   Basic Metabolic Panel: Recent Labs  Lab 10/17/19 0826 10/18/19 0520 10/19/19 0520 10/21/19 0916  NA 151*  --   --  141  K 3.1* 2.6* 3.7 3.7  CL 110  --   --  100  CO2 31  --   --  30  GLUCOSE 133*  --   --  136*  BUN 28*  --   --  19  CREATININE 0.57  --   --  0.45  CALCIUM 8.9  --   --  8.8*  MG  --   --   --  2.1    ABG: No results for input(s): PHART, PCO2ART, PO2ART, HCO3, O2SAT in the last 168 hours.  Liver Function Tests: No results for input(s): AST, ALT, ALKPHOS, BILITOT, PROT, ALBUMIN in the last 168 hours. No results for input(s): LIPASE, AMYLASE in the last 168 hours. No results for input(s): AMMONIA in the last 168 hours.  CBC: Recent Labs  Lab 10/17/19 0826 10/21/19 0916  WBC 10.0 9.6  HGB 10.6* 11.1*  HCT 34.8* 34.5*  MCV 102.7* 97.2  PLT 389 356    Cardiac Enzymes: No results for input(s): CKTOTAL, CKMB, CKMBINDEX, TROPONINI in the  last 168 hours.  BNP (last 3 results) No results for input(s): BNP in the last 8760 hours.  ProBNP (last 3 results) No results for input(s): PROBNP in the last 8760 hours.  Radiological Exams: Dg Hand 2 View Right  Result Date: 10/22/2019 CLINICAL DATA:  Right hand swelling EXAM: RIGHT HAND - 2 VIEW COMPARISON:  None. FINDINGS: No definite fracture or dislocation of the right hand. There is ulnar deviation of the digits, with some degree of subluxation of the first and second metacarpophalangeal joints, likely chronic and degenerative. Generally mild osteoarthritic pattern degenerative change about the hand and wrist, moderate in the thumb basal joints. Vascular calcinosis about the wrist. IMPRESSION: 1. No definite fracture or dislocation of the right hand. There is ulnar deviation of the digits, with some degree of subluxation of the first and second metacarpophalangeal joints, likely chronic and degenerative. 2. Generally mild osteoarthritic pattern degenerative change about the hand and wrist, moderate in the thumb basal joints. Electronically Signed   By: Lauralyn Primes M.D.   On: 10/22/2019 14:40    Assessment/Plan Active Problems:   Acute on chronic respiratory failure with hypoxia (HCC)   H/O Parkinson's disease   Chronic diastolic heart failure (HCC)   Tracheostomy status (HCC)  1. Acute on chronic respiratory failure with hypoxia continue with T collar trials titrate oxygen continue pulmonary toilet 2. History of Parkinson's disease at baseline continue with supportive care 3. Chronic diastolic heart failure no change 4. Tracheostomy status remains in place we will continue with supportive care   I have personally seen and evaluated the patient, evaluated laboratory and imaging results, formulated the assessment and plan and placed orders. The Patient requires high complexity decision making for assessment and support.  Case was discussed on Rounds with the Respiratory Therapy  Staff  Allyne Gee, MD [facility] Pulmonary Critical Care Medicine Sleep Medicine

## 2019-10-24 ENCOUNTER — Inpatient Hospital Stay
Admission: RE | Admit: 2019-10-24 | Discharge: 2019-10-25 | Disposition: A | Discharge status: Skilled Nursing Facility | Payer: Medicare Other | Source: Other Acute Inpatient Hospital | Attending: Internal Medicine | Admitting: Internal Medicine

## 2019-11-29 DEATH — deceased

## 2021-08-14 IMAGING — CT CT HEAD W/O CM
5 of 9 series · 18 of 47 positions shown, 19 images · non-contrast
Comparison: None.

CLINICAL DATA: Patient admitted to the hospital 09/26/2019. Altered
mental status today.

EXAM:
CT HEAD WITHOUT CONTRAST
CT CERVICAL SPINE WITHOUT CONTRAST
TECHNIQUE: Multidetector CT imaging of the head and cervical spine was
performed following the standard protocol without intravenous
contrast. Multiplanar CT image reconstructions of the cervical spine
were also generated.

[Series 3: head without · axial · non-contrast · 0.45mm/px · z∈[-80,-25]mm · 2 of 35 slices shown, 3 images]
[im 12/35  brain]
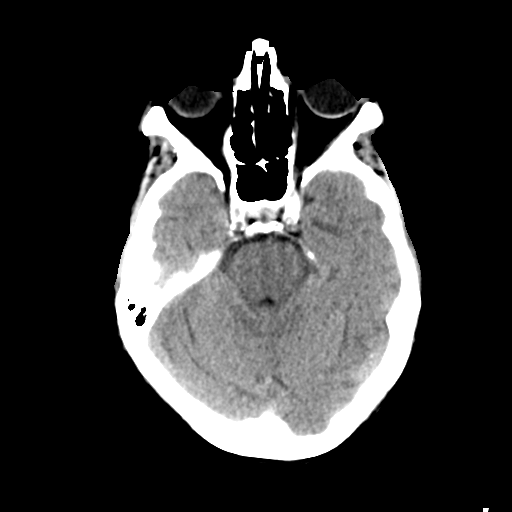
[im 12/35  bone]
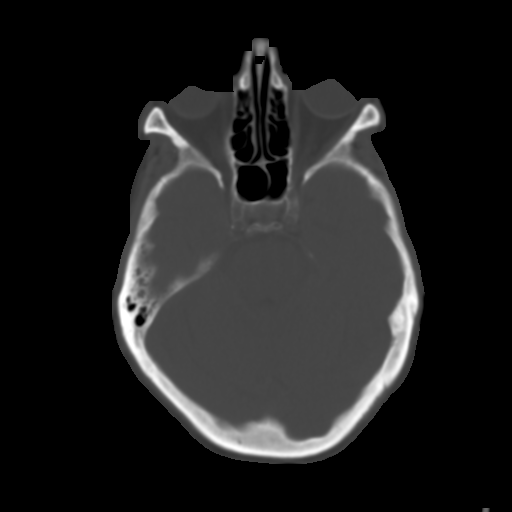
[im 23/35  brain]
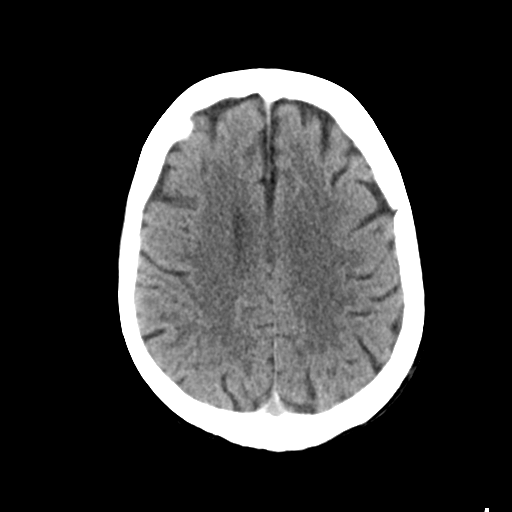

[Series 4: head bone · axial · 0.45mm/px · z∈[-119,+17]mm · 8 of 86 slices shown]
[im 9/86  bone]
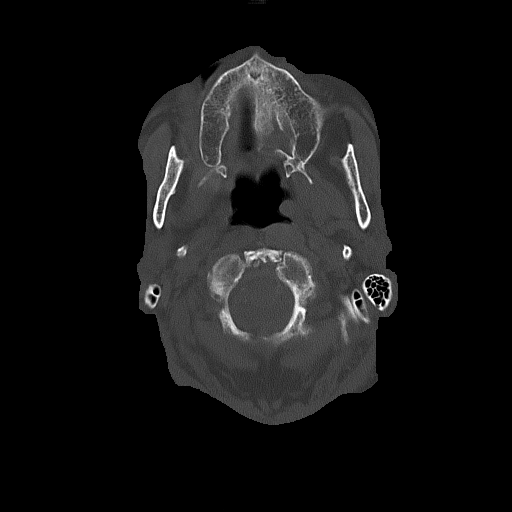
[im 18/86  bone]
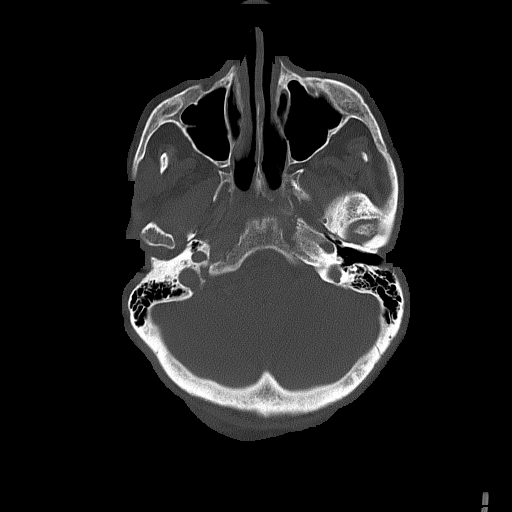
[im 26/86  bone]
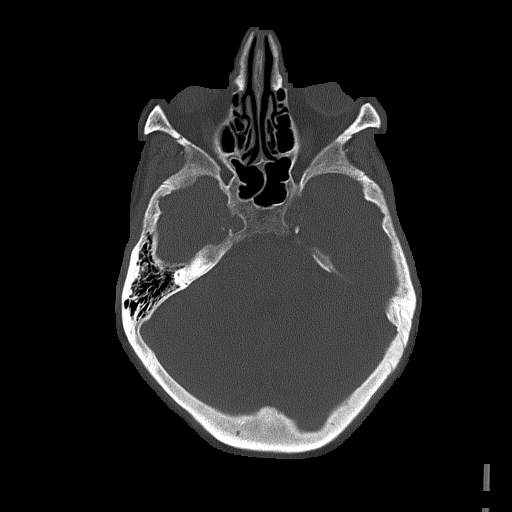
[im 35/86  bone]
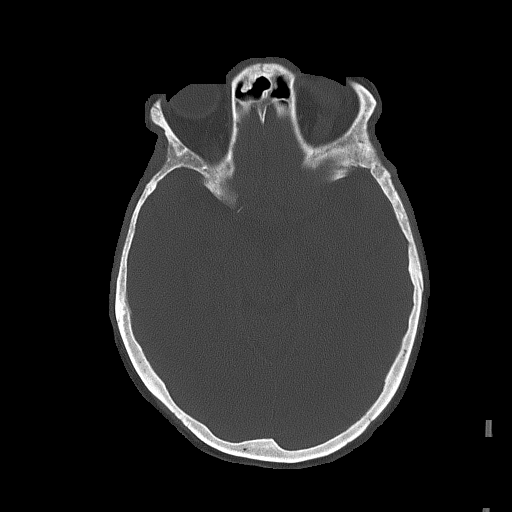
[im 52/86  bone]
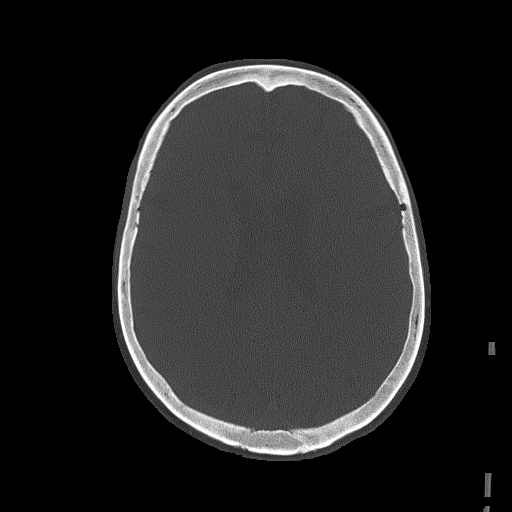
[im 60/86  bone]
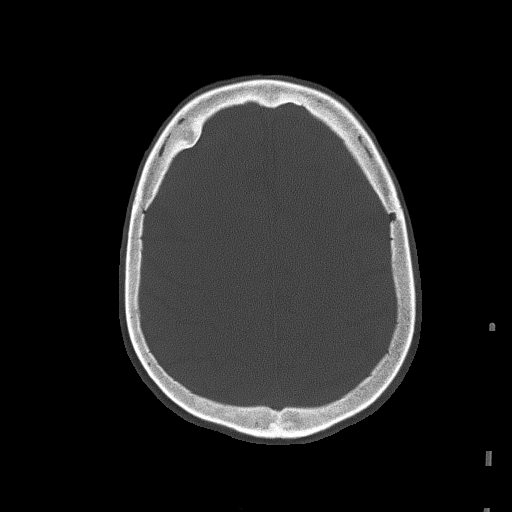
[im 69/86  bone]
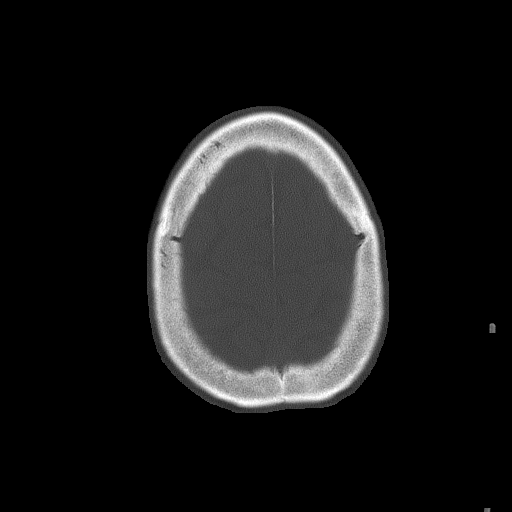
[im 77/86  bone]
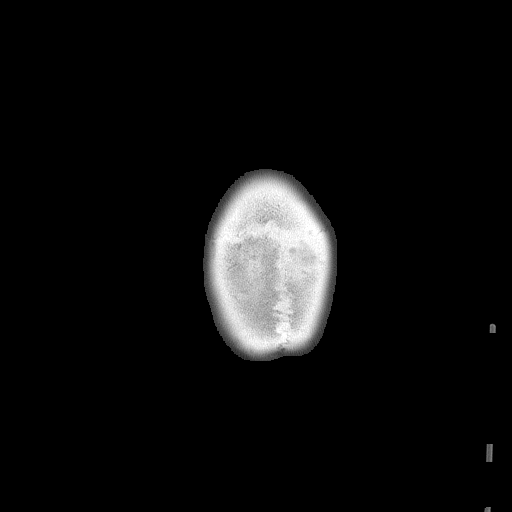

[Series 5: head without cor · coronal · non-contrast · 0.30mm/px · 3 of 71 slices shown]
[im 21/71  brain]
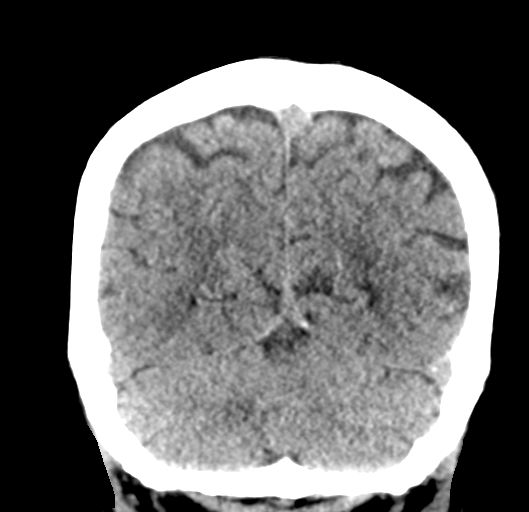
[im 31/71  brain]
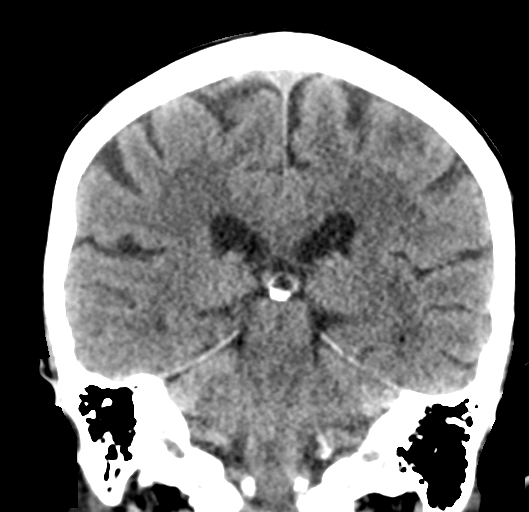
[im 41/71  brain]
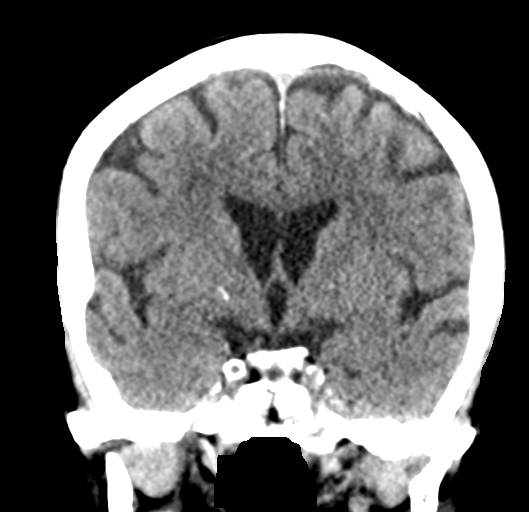

[Series 6: head without sag · sagittal · non-contrast · 0.34mm/px · 2 of 66 slices shown]
[im 22/66  brain]
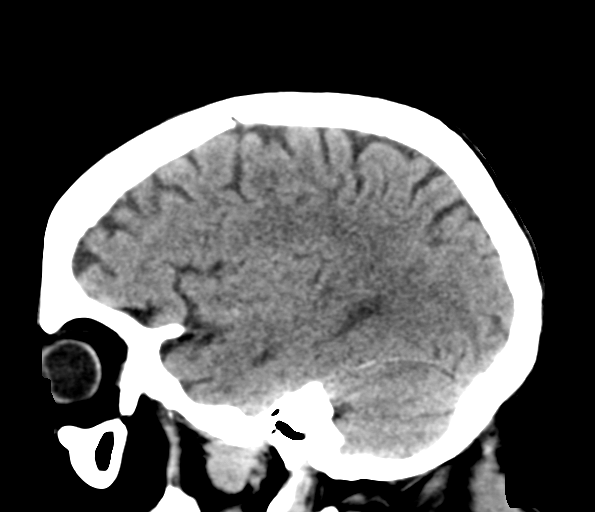
[im 44/66  brain]
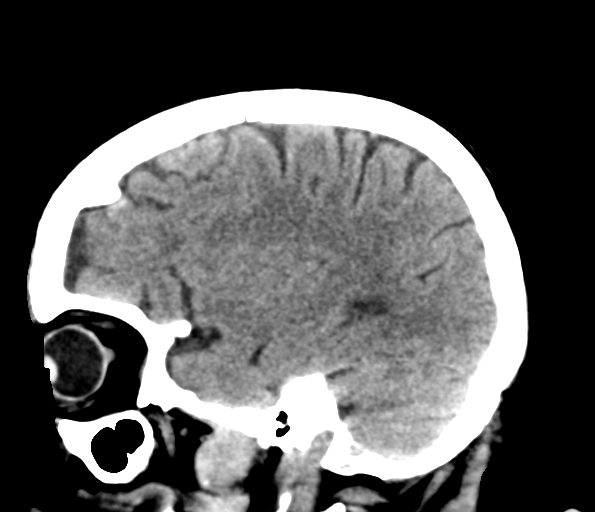

[Series 8: c_spine 2.0 st · axial · 0.30mm/px · z∈[-210,-176]mm · 3 of 68 slices shown]
[im 9/68  brain]
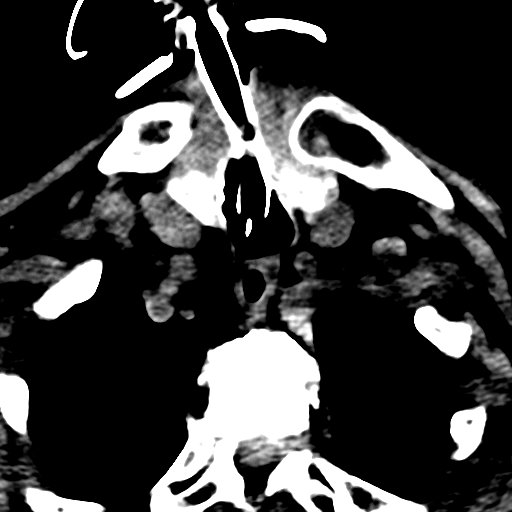
[im 17/68  brain]
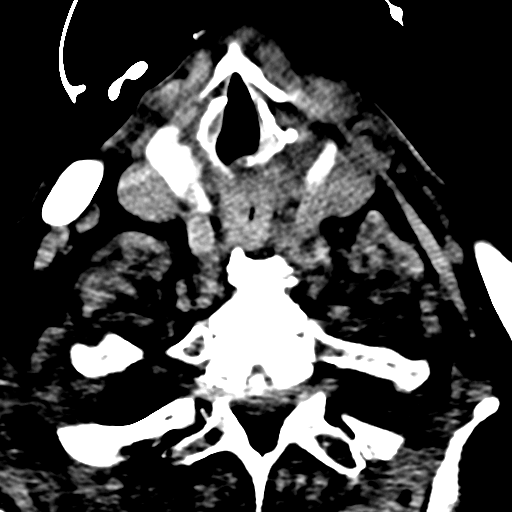
[im 26/68  brain]
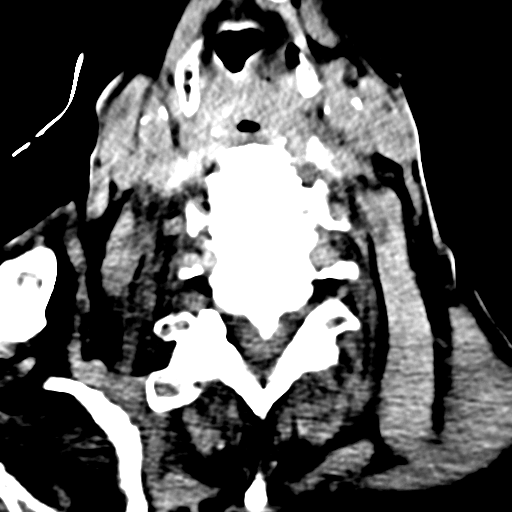

[18 of 47 positions shown; findings below may reference images not displayed]

FINDINGS: CT HEAD FINDINGS

Brain: No evidence of acute infarction, hemorrhage, hydrocephalus,
extra-axial collection or mass lesion/mass effect.

Vascular: No hyperdense vessel or unexpected calcification.

Skull: Intact.  No focal lesion.

Sinuses/Orbits: Status post cataract surgery on the left. Otherwise
negative

Other: None.

CT CERVICAL SPINE FINDINGS

Alignment: Facet degenerative disease results in 0.3 cm
anterolisthesis C4 on C5 and trace anterolisthesis C5 on C6 and C6
on C7. There is reversal of the normal cervical kyphosis from C5-T2.

Skull base and vertebrae: No acute fracture. No primary bone lesion
or focal pathologic process.

Soft tissues and spinal canal: No prevertebral fluid or swelling. No
visible canal hematoma.

Disc levels: Multilevel degenerative disc disease and facet
arthropathy.

Upper chest: Lung apices clear.

Other: None.
IMPRESSION: No acute abnormality head or cervical spine.

Multilevel cervical spondylosis.
